# Patient Record
Sex: Male | Born: 1945
Health system: Southern US, Community
[De-identification: ages and names within clinical notes are randomized; demographics above are authoritative.]

## PROBLEM LIST (undated history)

## (undated) DIAGNOSIS — I10 Essential (primary) hypertension: Secondary | ICD-10-CM

## (undated) DIAGNOSIS — D649 Anemia, unspecified: Secondary | ICD-10-CM

## (undated) DIAGNOSIS — C3490 Malignant neoplasm of unspecified part of unspecified bronchus or lung: Secondary | ICD-10-CM

## (undated) DIAGNOSIS — E785 Hyperlipidemia, unspecified: Secondary | ICD-10-CM

## (undated) DIAGNOSIS — R0600 Dyspnea, unspecified: Secondary | ICD-10-CM

## (undated) HISTORY — DX: Malignant neoplasm of unspecified part of unspecified bronchus or lung: C34.90

## (undated) HISTORY — PX: OTHER SURGICAL HISTORY: SHX169

## (undated) HISTORY — PX: CORONARY ANGIOPLASTY: SHX604

## (undated) HISTORY — PX: FRACTURE SURGERY: SHX138

---
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
## 2000-09-06 ENCOUNTER — Ambulatory Visit (HOSPITAL_COMMUNITY): Admission: RE | Admit: 2000-09-06 | Discharge: 2000-09-06 | Payer: Self-pay | Admitting: Gastroenterology

## 2000-09-06 ENCOUNTER — Encounter: Payer: Self-pay | Admitting: Gastroenterology

## 2006-12-21 ENCOUNTER — Emergency Department: Payer: Self-pay | Admitting: Emergency Medicine

## 2007-01-26 ENCOUNTER — Ambulatory Visit (HOSPITAL_COMMUNITY): Admission: RE | Admit: 2007-01-26 | Discharge: 2007-01-26 | Payer: Self-pay | Admitting: Orthopedic Surgery

## 2007-03-05 ENCOUNTER — Ambulatory Visit: Payer: Self-pay | Admitting: Family Medicine

## 2016-04-17 DIAGNOSIS — I10 Essential (primary) hypertension: Secondary | ICD-10-CM | POA: Insufficient documentation

## 2016-05-23 DIAGNOSIS — I208 Other forms of angina pectoris: Secondary | ICD-10-CM | POA: Diagnosis present

## 2016-06-07 ENCOUNTER — Ambulatory Visit
Admission: RE | Admit: 2016-06-07 | Discharge: 2016-06-08 | Disposition: A | Discharge status: Home / Self Care | Payer: Medicare Other | Source: Ambulatory Visit | Attending: Cardiology | Admitting: Cardiology

## 2016-06-07 ENCOUNTER — Encounter: Payer: Self-pay | Admitting: *Deleted

## 2016-06-07 ENCOUNTER — Encounter
Admission: RE | Disposition: A | Discharge status: Home / Self Care | Payer: Self-pay | Source: Ambulatory Visit | Attending: Cardiology

## 2016-06-07 DIAGNOSIS — Z87891 Personal history of nicotine dependence: Secondary | ICD-10-CM | POA: Insufficient documentation

## 2016-06-07 DIAGNOSIS — I208 Other forms of angina pectoris: Secondary | ICD-10-CM | POA: Diagnosis present

## 2016-06-07 DIAGNOSIS — Z79899 Other long term (current) drug therapy: Secondary | ICD-10-CM | POA: Insufficient documentation

## 2016-06-07 DIAGNOSIS — I10 Essential (primary) hypertension: Secondary | ICD-10-CM | POA: Diagnosis not present

## 2016-06-07 DIAGNOSIS — E7800 Pure hypercholesterolemia, unspecified: Secondary | ICD-10-CM | POA: Insufficient documentation

## 2016-06-07 DIAGNOSIS — I2510 Atherosclerotic heart disease of native coronary artery without angina pectoris: Secondary | ICD-10-CM | POA: Diagnosis present

## 2016-06-07 DIAGNOSIS — I25118 Atherosclerotic heart disease of native coronary artery with other forms of angina pectoris: Secondary | ICD-10-CM | POA: Insufficient documentation

## 2016-06-07 HISTORY — DX: Essential (primary) hypertension: I10

## 2016-06-07 HISTORY — PX: CARDIAC CATHETERIZATION: SHX172

## 2016-06-07 SURGERY — LEFT HEART CATH AND CORONARY ANGIOGRAPHY
Anesthesia: Moderate Sedation

## 2016-06-07 MED ORDER — SODIUM CHLORIDE 0.9% FLUSH: 3.0000 mL | INTRAVENOUS | Status: DC | PRN

## 2016-06-07 MED ORDER — HYDRALAZINE HCL 20 MG/ML IJ SOLN: 5.0000 mg | INTRAMUSCULAR | Status: AC | PRN

## 2016-06-07 MED ORDER — CLOPIDOGREL BISULFATE 75 MG PO TABS
ORAL_TABLET | ORAL | Status: AC
  Filled 2016-06-07: qty 3

## 2016-06-07 MED ORDER — SODIUM CHLORIDE 0.9 % WEIGHT BASED INFUSION
1.0000 mL/kg/h | INTRAVENOUS | Status: AC
  Administered 2016-06-07: 1 mL/kg/h via INTRAVENOUS

## 2016-06-07 MED ORDER — CLOPIDOGREL BISULFATE 75 MG PO TABS
ORAL_TABLET | ORAL | Status: DC | PRN
Start: 2016-06-07 — End: 2016-06-07
  Administered 2016-06-07: 600 mg via ORAL

## 2016-06-07 MED ORDER — HEPARIN (PORCINE) IN NACL 2-0.9 UNIT/ML-% IJ SOLN
INTRAMUSCULAR | Status: AC
Start: 2016-06-07 — End: 2016-06-07
  Filled 2016-06-07: qty 500

## 2016-06-07 MED ORDER — SODIUM CHLORIDE 0.9% FLUSH: 3.0000 mL | Freq: Two times a day (BID) | INTRAVENOUS | Status: DC

## 2016-06-07 MED ORDER — NITROGLYCERIN 1 MG/10 ML FOR IR/CATH LAB
INTRA_ARTERIAL | Status: DC | PRN
  Administered 2016-06-07: 300 ug via INTRACORONARY
  Administered 2016-06-07: 300 mL via INTRACORONARY

## 2016-06-07 MED ORDER — SODIUM CHLORIDE 0.9 % IV SOLN: 250.0000 mL | INTRAVENOUS | Status: DC | PRN

## 2016-06-07 MED ORDER — NITROGLYCERIN 5 MG/ML IV SOLN
INTRAVENOUS | Status: AC
Start: 2016-06-07 — End: 2016-06-07
  Filled 2016-06-07: qty 10

## 2016-06-07 MED ORDER — BIVALIRUDIN BOLUS VIA INFUSION - CUPID
INTRAVENOUS | Status: DC | PRN
  Administered 2016-06-07: 66 mg via INTRAVENOUS

## 2016-06-07 MED ORDER — IOPAMIDOL (ISOVUE-300) INJECTION 61%
INTRAVENOUS | Status: DC | PRN
  Administered 2016-06-07: 125 mL via INTRAVENOUS
  Administered 2016-06-07: 130 mL via INTRAVENOUS

## 2016-06-07 MED ORDER — ASPIRIN 81 MG PO CHEW
81.0000 mg | CHEWABLE_TABLET | Freq: Every day | ORAL | Status: DC
  Administered 2016-06-08: 81 mg via ORAL
  Filled 2016-06-07: qty 1

## 2016-06-07 MED ORDER — MIDAZOLAM HCL 2 MG/2ML IJ SOLN
INTRAMUSCULAR | Status: DC | PRN
  Administered 2016-06-07: 1 mg via INTRAVENOUS

## 2016-06-07 MED ORDER — MIDAZOLAM HCL 2 MG/2ML IJ SOLN
INTRAMUSCULAR | Status: AC
  Filled 2016-06-07: qty 2

## 2016-06-07 MED ORDER — BIVALIRUDIN 250 MG IV SOLR
INTRAVENOUS | Status: DC | PRN
  Administered 2016-06-07: 1.75 mg/kg/h via INTRAVENOUS

## 2016-06-07 MED ORDER — FENTANYL CITRATE (PF) 100 MCG/2ML IJ SOLN
INTRAMUSCULAR | Status: AC
  Filled 2016-06-07: qty 2

## 2016-06-07 MED ORDER — LABETALOL HCL 5 MG/ML IV SOLN: 10.0000 mg | INTRAVENOUS | Status: AC | PRN

## 2016-06-07 MED ORDER — BIVALIRUDIN 250 MG IV SOLR
INTRAVENOUS | Status: AC
  Filled 2016-06-07: qty 250

## 2016-06-07 MED ORDER — SODIUM CHLORIDE 0.9 % WEIGHT BASED INFUSION: 3.0000 mL/kg/h | INTRAVENOUS | Status: DC

## 2016-06-07 MED ORDER — METOPROLOL TARTRATE 25 MG PO TABS
25.0000 mg | ORAL_TABLET | Freq: Two times a day (BID) | ORAL | Status: DC
  Administered 2016-06-07: 25 mg via ORAL
  Filled 2016-06-07: qty 1

## 2016-06-07 MED ORDER — ONDANSETRON HCL 4 MG/2ML IJ SOLN: 4.0000 mg | Freq: Four times a day (QID) | INTRAMUSCULAR | Status: DC | PRN

## 2016-06-07 MED ORDER — ASPIRIN 81 MG PO CHEW
CHEWABLE_TABLET | ORAL | Status: AC
  Filled 2016-06-07: qty 4

## 2016-06-07 MED ORDER — ASPIRIN 81 MG PO CHEW
CHEWABLE_TABLET | ORAL | Status: DC | PRN
  Administered 2016-06-07: 324 mg via ORAL

## 2016-06-07 MED ORDER — FENTANYL CITRATE (PF) 100 MCG/2ML IJ SOLN
INTRAMUSCULAR | Status: DC | PRN
  Administered 2016-06-07: 50 ug via INTRAVENOUS

## 2016-06-07 MED ORDER — SODIUM CHLORIDE 0.9 % WEIGHT BASED INFUSION: 1.0000 mL/kg/h | INTRAVENOUS | Status: DC

## 2016-06-07 MED ORDER — SODIUM CHLORIDE 0.9% FLUSH
3.0000 mL | Freq: Two times a day (BID) | INTRAVENOUS | Status: DC
  Administered 2016-06-07 (×2): 3 mL via INTRAVENOUS

## 2016-06-07 MED ORDER — ACETAMINOPHEN 325 MG PO TABS: 650.0000 mg | ORAL_TABLET | ORAL | Status: DC | PRN

## 2016-06-07 MED ORDER — ASPIRIN 81 MG PO CHEW: 81.0000 mg | CHEWABLE_TABLET | ORAL | Status: DC

## 2016-06-07 MED ORDER — CLOPIDOGREL BISULFATE 75 MG PO TABS
ORAL_TABLET | ORAL | Status: AC
  Filled 2016-06-07: qty 7

## 2016-06-07 MED ORDER — CLOPIDOGREL BISULFATE 75 MG PO TABS
75.0000 mg | ORAL_TABLET | Freq: Every day | ORAL | Status: DC
  Administered 2016-06-08: 75 mg via ORAL
  Filled 2016-06-07: qty 1

## 2016-06-07 SURGICAL SUPPLY — 15 items
CATH 5FR PIGTAIL DIAGNOSTIC (CATHETERS) ×1 IMPLANT
CATH INFINITI 5FR JL4 (CATHETERS) ×1 IMPLANT
CATH INFINITI JR4 5F (CATHETERS) ×1 IMPLANT
CATH VISTA GUIDE 6FR JR4 SH (CATHETERS) ×1 IMPLANT
DEVICE CLOSURE MYNXGRIP 6/7F (Vascular Products) ×1 IMPLANT
DEVICE INFLAT 30 PLUS (MISCELLANEOUS) ×1 IMPLANT
KIT MANI 3VAL PERCEP (MISCELLANEOUS) ×3 IMPLANT
NDL PERC 18GX7CM (NEEDLE) IMPLANT
NEEDLE PERC 18GX7CM (NEEDLE) ×3 IMPLANT
PACK CARDIAC CATH (CUSTOM PROCEDURE TRAY) ×3 IMPLANT
SHEATH AVANTI 6FR X 11CM (SHEATH) ×1 IMPLANT
SHEATH PINNACLE 5F 10CM (SHEATH) ×1 IMPLANT
STENT XIENCE ALPINE RX 2.25X28 (Permanent Stent) ×1 IMPLANT
WIRE ASAHI PROWATER 180CM (WIRE) ×1 IMPLANT
WIRE EMERALD 3MM-J .035X150CM (WIRE) ×1 IMPLANT

## 2016-06-07 NOTE — Progress Notes (Signed)
Patient admitted to unit. Oriented to room, call bell, and staff. Bed in lowest position. Fall safety plan reviewed. Full assessment to Epic. Skin assessment verified with Casey Burkitt. Telemetry box verification with tele clerk and Jamie NT- Box#: ---[id number]. Will continue to monitor. Cath site WDL. No complaints at this time.

## 2016-06-07 NOTE — OR Nursing (Signed)
Report called to the floor.  Pt resting in bed, no active bleeding or hematoma

## 2016-06-07 NOTE — Care Management (Signed)
Elective cardiac cath resulting in PCI. At present, patient is not on a cost prohibitive antiplatelet.

## 2016-06-08 DIAGNOSIS — I25118 Atherosclerotic heart disease of native coronary artery with other forms of angina pectoris: Secondary | ICD-10-CM | POA: Diagnosis not present

## 2016-06-08 LAB — BASIC METABOLIC PANEL
Anion gap: 4 — ABNORMAL LOW (ref 5–15)
BUN: 7 mg/dL (ref 6–20)
CO2: 25 mmol/L (ref 22–32)
Calcium: 8.3 mg/dL — ABNORMAL LOW (ref 8.9–10.3)
Chloride: 110 mmol/L (ref 101–111)
Creatinine, Ser: 0.91 mg/dL (ref 0.61–1.24)
GFR calc Af Amer: >60 mL/min (ref >60)
GFR calc non Af Amer: >60 mL/min (ref >60)
Glucose, Bld: 96 mg/dL (ref 65–99)
Potassium: 3.7 mmol/L (ref 3.5–5.1)
Sodium: 139 mmol/L (ref 135–145)

## 2016-06-08 LAB — CBC
HCT: 40.7 % (ref 40.0–52.0)
Hemoglobin: 13 g/dL (ref 13.0–18.0)
MCH: 28.6 pg (ref 26.0–34.0)
MCHC: 32.1 g/dL (ref 32.0–36.0)
MCV: 89.1 fL (ref 80.0–100.0)
Platelets: 121 10*3/uL — ABNORMAL LOW (ref 150–440)
RBC: 4.56 MIL/uL (ref 4.40–5.90)
RDW: 14.2 % (ref 11.5–14.5)
WBC: 5.9 10*3/uL (ref 3.8–10.6)

## 2016-06-08 MED ORDER — ASPIRIN 81 MG PO CHEW: 81.0000 mg | CHEWABLE_TABLET | Freq: Every day | ORAL | 4 refills | Status: DC

## 2016-06-08 MED ORDER — ATORVASTATIN CALCIUM 80 MG PO TABS: 80.0000 mg | ORAL_TABLET | Freq: Every day | ORAL | 4 refills | Status: DC

## 2016-06-08 MED ORDER — CLOPIDOGREL BISULFATE 75 MG PO TABS: 75.0000 mg | ORAL_TABLET | Freq: Every day | ORAL | 4 refills | Status: AC

## 2016-06-08 NOTE — Discharge Summary (Signed)
[facility] Cardiology Discharge Summary  Patient ID: Ryan Harvey MRN: [id number] DOB/AGE: [birth year] 70 y.o.  Admit date: 06/07/2016 Discharge date: 06/08/2016  Primary Discharge Diagnosis: Ischemic chest pain I20.9 Secondary Discharge Diagnosis high blood pressure and high cholesterol  Significant Diagnostic Studies: Cardiac cath with left ventricular angiogram and selective coronary injection as well as PCI and stent placement of right coronary artery.  Hospital Course: The patient was admitted to specials for cardiac cath with selective coronary angiogram after full consent, risk and benefits explained, and time out called with all approprate details voiced and discussed. The patient has had progressive canadian class 3 angina with high probability risk stress test consistent with ischemic chest pain and or anginal equivalent with coronary artery risk factors including high blood pressure and high cholesterol. The procedure was performed without complication and it revealed normal left ventricular function with ejection fraction of 55%.  It was found that the patient had severe 1 vessel coronary atherosclerosis with significant right coronary artery stenosis requiring further intervention. Therefore, the patient had a PCI and drug eluding stent placed without complication. The patient has been ambulating without further significant symptoms and has reached his maximal hospital benefit and will be discharged to home in good condition.  Cardiac rehabilitation has been discussed and recommended. Medication management of cardiovascular risk factors will be given post discharge and modified as an outpatient.   Discharge Exam: Blood pressure (!) 163/72, pulse (!) 59, temperature 97.8 F (36.6 C), temperature source Oral, resp. rate 18, height '5\' 9"'$  (1.753 m), weight 88.9 kg (196 lb), SpO2 95 %.  Constitutional: [place] oriented to person, place, and time. No distress.  HENT: No nasal  discharge.  Head: Normocephalic and atraumatic.  Eyes: Pupils are equal and round. No discharge.  Neck: Normal range of motion. Neck supple. No JVD present. No thyromegaly present.  Cardiovascular: Normal rate, regular rhythm, normal S1 S2, no gallop, no friction rub. No murmur Pulmonary/Chest: Effort normal, No stridor. No respiratory distress. no wheezes.  no rales.    Abdominal: Soft. Bowel sounds are normal.  no distension.  no tenderness. There is no rebound and no guarding.  Musculoskeletal: No edema, no cyanosis, normal pulses, no bleeding, Normal range of motion. no tenderness.  Neurological:  alert and oriented to person, place, and time. Coordination normal.  Skin: Skin is warm and dry. No rash noted. No erythema. No pallor.  Psychiatric:  normal mood and affect. behavior is normal.    Labs:   Lab Results  Component Value Date   WBC 5.9 06/08/2016   HGB 13.0 06/08/2016   HCT 40.7 06/08/2016   MCV 89.1 06/08/2016   PLT 121 (L) 06/08/2016    Recent Labs Lab 06/08/16 0335  NA 139  K 3.7  CL 110  CO2 25  BUN 7  CREATININE 0.91  CALCIUM 8.3*  GLUCOSE 96    EKG: NSR without evidence of new changes  FOLLOW UP IN ONE TO TWO WEEKS  Allergies as of 06/08/2016      Reactions   Shellfish Allergy       Medication List    TAKE these medications   aspirin 81 MG chewable tablet Chew 1 tablet (81 mg total) by mouth daily.   atorvastatin 80 MG tablet Commonly known as:  LIPITOR Take 1 tablet (80 mg total) by mouth daily.   clopidogrel 75 MG tablet Commonly known as:  PLAVIX Take 1 tablet (75 mg total) by mouth daily with breakfast.  metoprolol tartrate 25 MG tablet Commonly known as:  LOPRESSOR Take 25 mg by mouth 2 (two) times daily.        THE PATIENT  SHALL BRING ALL MEDICATIONS TO FOLLOW UP APPOINTMENT  Signed:  Corey Skains MD, [facility] 06/08/2016, 8:02 AM

## 2016-06-08 NOTE — Progress Notes (Signed)
Patient vitals stable, cath femoral site stable, level 0. No pain. Patient discharged to home. Instructions, prescription pickup instructions, and follow up MD appt given to patient.   IVs and tele d/c'd and removed.  Patient leaving floor via wheelchair.

## 2016-06-23 DIAGNOSIS — E782 Mixed hyperlipidemia: Secondary | ICD-10-CM | POA: Insufficient documentation

## 2018-06-27 ENCOUNTER — Ambulatory Visit: Admission: RE | Admit: 2018-06-27 | Payer: Medicare Other | Source: Home / Self Care | Admitting: Internal Medicine

## 2018-06-27 ENCOUNTER — Encounter: Admission: RE | Payer: Self-pay | Source: Home / Self Care

## 2018-06-27 SURGERY — LEFT HEART CATH AND CORONARY ANGIOGRAPHY
Anesthesia: Moderate Sedation | Laterality: Left

## 2018-07-09 ENCOUNTER — Other Ambulatory Visit: Payer: Self-pay

## 2018-07-09 ENCOUNTER — Encounter (INDEPENDENT_AMBULATORY_CARE_PROVIDER_SITE_OTHER): Payer: Self-pay

## 2018-07-09 ENCOUNTER — Inpatient Hospital Stay: Payer: Medicare Other

## 2018-07-09 ENCOUNTER — Inpatient Hospital Stay: Payer: Medicare Other | Attending: Oncology | Admitting: Oncology

## 2018-07-09 ENCOUNTER — Encounter: Payer: Self-pay | Admitting: Oncology

## 2018-07-09 VITALS — BP 140/63 | HR 69 | Resp 18

## 2018-07-09 VITALS — BP 139/57 | HR 64 | Temp 97.1°F | Resp 16 | Ht 69.0 in | Wt 185.5 lb

## 2018-07-09 DIAGNOSIS — D500 Iron deficiency anemia secondary to blood loss (chronic): Secondary | ICD-10-CM

## 2018-07-09 DIAGNOSIS — D509 Iron deficiency anemia, unspecified: Secondary | ICD-10-CM | POA: Diagnosis not present

## 2018-07-09 LAB — CBC WITH DIFFERENTIAL/PLATELET
Abs Immature Granulocytes: 0.01 10*3/uL (ref 0.00–0.07)
Basophils Absolute: 0 10*3/uL (ref 0.0–0.1)
Basophils Relative: 1 %
Eosinophils Absolute: 0.1 10*3/uL (ref 0.0–0.5)
Eosinophils Relative: 2 %
HCT: 31.5 % — ABNORMAL LOW (ref 39.0–52.0)
Hemoglobin: 8.1 g/dL — ABNORMAL LOW (ref 13.0–17.0)
Immature Granulocytes: 0 %
Lymphocytes Relative: 26 %
Lymphs Abs: 1.5 10*3/uL (ref 0.7–4.0)
MCH: 19.8 pg — ABNORMAL LOW (ref 26.0–34.0)
MCHC: 25.7 g/dL — ABNORMAL LOW (ref 30.0–36.0)
MCV: 76.8 fL — ABNORMAL LOW (ref 80.0–100.0)
Monocytes Absolute: 0.4 10*3/uL (ref 0.1–1.0)
Monocytes Relative: 6 %
Neutro Abs: 3.8 10*3/uL (ref 1.7–7.7)
Neutrophils Relative %: 65 %
Platelets: 226 10*3/uL (ref 150–400)
RBC: 4.1 MIL/uL — ABNORMAL LOW (ref 4.22–5.81)
RDW: 16.3 % — ABNORMAL HIGH (ref 11.5–15.5)
WBC: 5.9 10*3/uL (ref 4.0–10.5)
nRBC: 0 % (ref 0.0–0.2)

## 2018-07-09 LAB — FOLATE: Folate: 15.4 ng/mL (ref >5.9)

## 2018-07-09 LAB — RETICULOCYTES
Immature Retic Fract: 17.3 % — ABNORMAL HIGH (ref 2.3–15.9)
RBC.: 4.1 MIL/uL — ABNORMAL LOW (ref 4.22–5.81)
Retic Count, Absolute: 47.6 10*3/uL (ref 19.0–186.0)
Retic Ct Pct: 1.2 % (ref 0.4–3.1)

## 2018-07-09 LAB — TSH: TSH: 3.821 u[IU]/mL (ref 0.350–4.500)

## 2018-07-09 LAB — URINALYSIS, COMPLETE (UACMP) WITH MICROSCOPIC
Bacteria, UA: NONE SEEN
Bilirubin Urine: NEGATIVE
Glucose, UA: NEGATIVE mg/dL
Hgb urine dipstick: NEGATIVE
Ketones, ur: NEGATIVE mg/dL
Leukocytes, UA: NEGATIVE
Nitrite: NEGATIVE
Protein, ur: NEGATIVE mg/dL
Specific Gravity, Urine: 1.018 (ref 1.005–1.030)
pH: 5 (ref 5.0–8.0)

## 2018-07-09 LAB — SAMPLE TO BLOOD BANK

## 2018-07-09 LAB — VITAMIN B12: Vitamin B-12: 456 pg/mL (ref 180–914)

## 2018-07-09 MED ORDER — SODIUM CHLORIDE 0.9 % IV SOLN
510.0000 mg | Freq: Once | INTRAVENOUS | Status: AC
  Administered 2018-07-09: 510 mg via INTRAVENOUS
  Filled 2018-07-09: qty 17

## 2018-07-09 MED ORDER — SODIUM CHLORIDE 0.9 % IV SOLN
Freq: Once | INTRAVENOUS | Status: AC
  Administered 2018-07-09: 12:00:00 via INTRAVENOUS
  Filled 2018-07-09: qty 250

## 2018-07-09 NOTE — Progress Notes (Signed)
Hematology/Oncology Consult note [facility] [phone] [phone]  Patient Care Team: Patient, No Pcp Per as PCP - General (General Practice)   Name of the patient: Ryan Harvey  [id number]  11-22-1945    Reason for referral-iron deficiency anemia   Referring [place]  Date of visit: 07/09/18   History of presenting illness-patient is a 73 year old male who is been referred to [place] for iron deficiency anemia.  His recent CBC from 07/04/2018 showed white count of 5.8, H&H of 7.9/29.5 with an MCV of 77.2 and a platelet count of 199.  Prior to that on 06/21/2018 his H&H was 8.1/29.8.  His hemoglobin at baseline was normal at 15 back in 2017.  Iron studies reveal evidence of iron deficiency with a ferritin of 4, low iron saturation of 15 and a TIBC that was elevated at 576.  Folic acid levels were normal.  Stool occult cards were negative but patient was seen by GI and will be undergoing EGD and colonoscopy tomorrow.  He took iron tablets for 2 days but now it is currently on hold in view of anticipated endoscopy.  He reports no consistent use of NSAIDs.  Denies any blood in his stool or urine.  Denies any dark melanotic stools.  Patient also has ongoing cardiac evaluation given that he did not clear his stress test and may potentially need cardiac stenting in the future.  His anemia was incidentally diagnosed during his cardiac evaluation   ECOG PS- 1  Pain scale- 0   Review of systems- Review of Systems  Constitutional: Positive for malaise/fatigue. Negative for chills, fever and weight loss.  HENT: Negative for congestion, ear discharge and nosebleeds.   Eyes: Negative for blurred vision.  Respiratory: Negative for cough, hemoptysis, sputum production, shortness of breath and wheezing.   Cardiovascular: Negative for chest pain, palpitations, orthopnea and claudication.  Gastrointestinal: Negative for abdominal pain, blood in stool,  constipation, diarrhea, heartburn, melena, nausea and vomiting.  Genitourinary: Negative for dysuria, flank pain, frequency, hematuria and urgency.  Musculoskeletal: Negative for back pain, joint pain and myalgias.  Skin: Negative for rash.  Neurological: Negative for dizziness, tingling, focal weakness, seizures, weakness and headaches.  Endo/Heme/Allergies: Does not bruise/bleed easily.  Psychiatric/Behavioral: Negative for depression and suicidal ideas. The patient does not have insomnia.     Allergies  Allergen Reactions  . Shellfish Allergy Rash and Swelling    Patient Active Problem List   Diagnosis Date Noted  . Iron deficiency anemia due to chronic blood loss 07/09/2018  . CAD (coronary artery disease) 06/07/2016  . Stable angina ([place]) 05/23/2016     Past Medical History:  Diagnosis Date  . Hypertension      Past Surgical History:  Procedure Laterality Date  . CARDIAC CATHETERIZATION Left 06/07/2016   Procedure: Left Heart Cath and Coronary Angiography;  Surgeon: Corey Skains, MD;  Location: [place] CV LAB;  Service: Cardiovascular;  Laterality: Left;  . CARDIAC CATHETERIZATION N/A 06/07/2016   Procedure: Coronary Stent Intervention;  Surgeon: Isaias Cowman, MD;  Location: [place] CV LAB;  Service: Cardiovascular;  Laterality: N/A;    Social History   Socioeconomic History  . Marital status: Married    Spouse name: Not on file  . Number of children: Not on file  . Years of education: Not on file  . Highest education level: Not on file  Occupational History  . Not on file  Social Needs  . Financial resource strain: Not on file  .  Food insecurity:    Worry: Not on file    Inability: Not on file  . Transportation needs:    Medical: Not on file    Non-medical: Not on file  Tobacco Use  . Smoking status: Former Research scientist (life sciences)  . Smokeless tobacco: Never Used  Substance and Sexual Activity  . Alcohol use: Not Currently  . Drug use: No  .  Sexual activity: Not on file  Lifestyle  . Physical activity:    Days per week: Not on file    Minutes per session: Not on file  . Stress: Not on file  Relationships  . Social connections:    Talks on phone: Not on file    Gets together: Not on file    Attends religious service: Not on file    Active member of club or organization: Not on file    Attends meetings of clubs or organizations: Not on file    Relationship status: Not on file  . Intimate partner violence:    Fear of current or ex partner: Not on file    Emotionally abused: Not on file    Physically abused: Not on file    Forced sexual activity: Not on file  Other Topics Concern  . Not on file  Social History Narrative  . Not on file     Family History  Problem Relation Age of Onset  . Hypertension Mother   . Stroke Father   . Heart disease Brother   . Cancer Brother   . Peripheral Artery Disease Brother      Current Outpatient Medications:  .  amLODipine (NORVASC) 5 MG tablet, Take 5 mg by mouth every evening., Disp: , Rfl:  .  atorvastatin (LIPITOR) 80 MG tablet, Take 1 tablet (80 mg total) by mouth daily. (Patient taking differently: Take 80 mg by mouth every evening. ), Disp: 90 tablet, Rfl: 4 .  clopidogrel (PLAVIX) 75 MG tablet, Take 1 tablet (75 mg total) by mouth daily with breakfast. (Patient taking differently: Take 75 mg by mouth every evening. ), Disp: 90 tablet, Rfl: 4 .  cyanocobalamin (,VITAMIN B-12,) 1000 MCG/ML injection, Inject 1 mL into the muscle every 21 ( twenty-one) days. For 1 month, Disp: , Rfl:  .  Iron-Vitamin C (VITRON-C) 65-125 MG TABS, Take 1 tablet by mouth daily., Disp: , Rfl:  .  Misc Natural Products (NF FORMULAS TESTOSTERONE) CAPS, Take 1 capsule by mouth daily as needed (energy)., Disp: , Rfl:  .  Multiple Vitamin (MULTIVITAMIN WITH MINERALS) TABS tablet, Take 1 tablet by mouth daily as needed (energy)., Disp: , Rfl:  .  Vitamin D, Ergocalciferol, (DRISDOL) 1.25 MG (50000 UT)  CAPS capsule, Take 1 capsule by mouth once a week. For 8 weeks, Disp: , Rfl:    Physical exam:  Vitals:   07/09/18 0951 07/09/18 1006  BP:  (!) 139/57  Pulse:  64  Resp: 16   Temp:  (!) 97.1 F (36.2 C)  TempSrc:  Tympanic  SpO2:  98%  Weight: 185 lb 8 oz (84.1 kg)   Height: 5\' 9"  (1.753 m)    Physical Exam Constitutional:      General: He is not in acute distress. HENT:     Head: Normocephalic and atraumatic.  Eyes:     Pupils: Pupils are equal, round, and reactive to light.  Neck:     Musculoskeletal: Normal range of motion.  Cardiovascular:     Rate and Rhythm: Normal rate and regular rhythm.  Heart sounds: Normal heart sounds.  Pulmonary:     Effort: Pulmonary effort is normal.     Breath sounds: Normal breath sounds.  Abdominal:     General: Bowel sounds are normal.     Palpations: Abdomen is soft.  Skin:    General: Skin is warm and dry.  Neurological:     Mental Status: He is alert and oriented to person, place, and time.        CMP Latest Ref Rng & Units 06/08/2016  Glucose 65 - 99 mg/dL 96  BUN 6 - 20 mg/dL 7  Creatinine 0.61 - 1.24 mg/dL 0.91  Sodium 135 - 145 mmol/L 139  Potassium 3.5 - 5.1 mmol/L 3.7  Chloride 101 - 111 mmol/L 110  CO2 22 - 32 mmol/L 25  Calcium 8.9 - 10.3 mg/dL 8.3(L)   CBC Latest Ref Rng & Units 07/09/2018  WBC 4.0 - 10.5 K/uL 5.9  Hemoglobin 13.0 - 17.0 g/dL 8.1(L)  Hematocrit 39.0 - 52.0 % 31.5(L)  Platelets 150 - 400 K/uL 226     Assessment and plan- Patient is a 73 y.o. male referred for iron deficiency anemia possibly secondary to GI bleeding  His recent labs reveal evidence of significant iron deficiency anemia.  He is scheduled for EGD and colonoscopy today.  At this time I would recommend IV iron Feraheme 510 mg x 2 doses.  Discussed risks and benefits of Feraheme including all but not limited to headache, leg swelling and possible risk of infusion reaction.  Patient understands and agrees to proceed as planned.   We will also check his other anemia work-up including B12, folate, reticulocyte count, TSH and urinalysis.  I will see him back in 2 months time with repeat CBC ferritin and iron studies.  He will likely undergo capsule endoscopy if EGD and colonoscopy are negative   Thank you for this kind referral and the opportunity to participate in the care of this patient   Visit Diagnosis 1. Iron deficiency anemia due to chronic blood loss   2. Microcytic anemia     Dr. Randa Evens, MD, MPH [facility] [phone] 07/09/2018 1:18 PM

## 2018-07-09 NOTE — Progress Notes (Signed)
Patient here for initial visit, he reports dyspnea which has gotten worse in the last month. He also reports occasional numb feeling in his entire body. Appetite fair. No problems with sleep and rest.

## 2018-07-10 ENCOUNTER — Encounter
Admission: RE | Disposition: A | Discharge status: Home / Self Care | Payer: Self-pay | Source: Home / Self Care | Attending: Internal Medicine

## 2018-07-10 ENCOUNTER — Ambulatory Visit: Payer: Medicare Other | Admitting: Anesthesiology

## 2018-07-10 ENCOUNTER — Ambulatory Visit
Admission: RE | Admit: 2018-07-10 | Discharge: 2018-07-10 | Disposition: A | Discharge status: Home / Self Care | Payer: Medicare Other | Attending: Internal Medicine | Admitting: Internal Medicine

## 2018-07-10 ENCOUNTER — Encounter: Payer: Self-pay | Admitting: Anesthesiology

## 2018-07-10 DIAGNOSIS — Z832 Family history of diseases of the blood and blood-forming organs and certain disorders involving the immune mechanism: Secondary | ICD-10-CM | POA: Diagnosis not present

## 2018-07-10 DIAGNOSIS — K222 Esophageal obstruction: Secondary | ICD-10-CM | POA: Insufficient documentation

## 2018-07-10 DIAGNOSIS — K640 First degree hemorrhoids: Secondary | ICD-10-CM | POA: Diagnosis not present

## 2018-07-10 DIAGNOSIS — Z823 Family history of stroke: Secondary | ICD-10-CM | POA: Diagnosis not present

## 2018-07-10 DIAGNOSIS — Z87891 Personal history of nicotine dependence: Secondary | ICD-10-CM | POA: Insufficient documentation

## 2018-07-10 DIAGNOSIS — K621 Rectal polyp: Secondary | ICD-10-CM | POA: Diagnosis not present

## 2018-07-10 DIAGNOSIS — K31811 Angiodysplasia of stomach and duodenum with bleeding: Secondary | ICD-10-CM | POA: Insufficient documentation

## 2018-07-10 DIAGNOSIS — D509 Iron deficiency anemia, unspecified: Secondary | ICD-10-CM | POA: Diagnosis not present

## 2018-07-10 DIAGNOSIS — Z7902 Long term (current) use of antithrombotics/antiplatelets: Secondary | ICD-10-CM | POA: Insufficient documentation

## 2018-07-10 DIAGNOSIS — Z91013 Allergy to seafood: Secondary | ICD-10-CM | POA: Diagnosis not present

## 2018-07-10 DIAGNOSIS — K2210 Ulcer of esophagus without bleeding: Secondary | ICD-10-CM | POA: Diagnosis not present

## 2018-07-10 DIAGNOSIS — Z8249 Family history of ischemic heart disease and other diseases of the circulatory system: Secondary | ICD-10-CM | POA: Insufficient documentation

## 2018-07-10 DIAGNOSIS — K210 Gastro-esophageal reflux disease with esophagitis: Secondary | ICD-10-CM | POA: Diagnosis not present

## 2018-07-10 DIAGNOSIS — Z808 Family history of malignant neoplasm of other organs or systems: Secondary | ICD-10-CM | POA: Diagnosis not present

## 2018-07-10 DIAGNOSIS — I25119 Atherosclerotic heart disease of native coronary artery with unspecified angina pectoris: Secondary | ICD-10-CM | POA: Diagnosis not present

## 2018-07-10 DIAGNOSIS — I10 Essential (primary) hypertension: Secondary | ICD-10-CM | POA: Insufficient documentation

## 2018-07-10 DIAGNOSIS — K449 Diaphragmatic hernia without obstruction or gangrene: Secondary | ICD-10-CM | POA: Insufficient documentation

## 2018-07-10 DIAGNOSIS — Z833 Family history of diabetes mellitus: Secondary | ICD-10-CM | POA: Insufficient documentation

## 2018-07-10 DIAGNOSIS — Z79899 Other long term (current) drug therapy: Secondary | ICD-10-CM | POA: Insufficient documentation

## 2018-07-10 HISTORY — PX: COLONOSCOPY WITH PROPOFOL: SHX5780

## 2018-07-10 HISTORY — PX: ESOPHAGOGASTRODUODENOSCOPY (EGD) WITH PROPOFOL: SHX5813

## 2018-07-10 SURGERY — COLONOSCOPY WITH PROPOFOL
Anesthesia: General

## 2018-07-10 MED ORDER — PROPOFOL 10 MG/ML IV BOLUS
INTRAVENOUS | Status: DC | PRN
  Administered 2018-07-10: 30 mg via INTRAVENOUS
  Administered 2018-07-10 (×2): 50 mg via INTRAVENOUS

## 2018-07-10 MED ORDER — PROPOFOL 500 MG/50ML IV EMUL
INTRAVENOUS | Status: DC | PRN
  Administered 2018-07-10: 80 ug/kg/min via INTRAVENOUS

## 2018-07-10 MED ORDER — PROPOFOL 500 MG/50ML IV EMUL
INTRAVENOUS | Status: AC
  Filled 2018-07-10: qty 50

## 2018-07-10 MED ORDER — SODIUM CHLORIDE 0.9 % IV SOLN
INTRAVENOUS | Status: DC
  Administered 2018-07-10: 12:00:00 via INTRAVENOUS
  Administered 2018-07-10: 1000 mL via INTRAVENOUS

## 2018-07-10 NOTE — H&P (Signed)
  Outpatient short stay form Pre-procedure 07/10/2018 11:19 AM Avaleen Brownley K. Alice Reichert, M.D.  Primary Physician: Grayland Ormond, PA-C  Reason for visit: Symptomatic iron deficiency anemia secondary to chronic blood loss.  History of present illness: 73 year old male presents for market decrease in hemoglobin to 5.8 from a baseline of 15.1.  Patient complains of dyspnea on exertion as well as chronic fatigue.  No gross melena, hematemesis or hematochezia.  Patient received IV iron at Dr. Elroy Channel office yesterday and hematology.  Patient is colonoscopy nave.  Denies abdominal pain, nausea or vomiting.  Denies any significant constipation symptoms.  Patient takes Plavix for personal history of coronary artery disease.   No current facility-administered medications for this encounter.   Current Outpatient Medications:  .  tadalafil (CIALIS) 10 MG tablet, Take 10 mg by mouth daily as needed for erectile dysfunction., Disp: , Rfl:  .  amLODipine (NORVASC) 5 MG tablet, Take 5 mg by mouth every evening., Disp: , Rfl:  .  atorvastatin (LIPITOR) 80 MG tablet, Take 1 tablet (80 mg total) by mouth daily. (Patient taking differently: Take 80 mg by mouth every evening. ), Disp: 90 tablet, Rfl: 4 .  clopidogrel (PLAVIX) 75 MG tablet, Take 1 tablet (75 mg total) by mouth daily with breakfast. (Patient taking differently: Take 75 mg by mouth every evening. ), Disp: 90 tablet, Rfl: 4 .  cyanocobalamin (,VITAMIN B-12,) 1000 MCG/ML injection, Inject 1 mL into the muscle every 21 ( twenty-one) days. For 1 month, Disp: , Rfl:  .  Iron-Vitamin C (VITRON-C) 65-125 MG TABS, Take 1 tablet by mouth daily., Disp: , Rfl:  .  Misc Natural Products (NF FORMULAS TESTOSTERONE) CAPS, Take 1 capsule by mouth daily as needed (energy)., Disp: , Rfl:  .  Multiple Vitamin (MULTIVITAMIN WITH MINERALS) TABS tablet, Take 1 tablet by mouth daily as needed (energy)., Disp: , Rfl:  .  Vitamin D, Ergocalciferol, (DRISDOL) 1.25 MG (50000 UT) CAPS  capsule, Take 1 capsule by mouth once a week. For 8 weeks, Disp: , Rfl:   No medications prior to admission.     Allergies  Allergen Reactions  . Shellfish Allergy Rash and Swelling     Past Medical History:  Diagnosis Date  . Hypertension     Review of systems:  Otherwise negative.    Physical Exam  Gen: Alert, oriented. Appears stated age.  HEENT: [place]/AT. PERRLA. Lungs: CTA, no wheezes. CV: RR nl S1, S2. Abd: soft, benign, no masses. BS+ Ext: No edema. Pulses 2+    Planned procedures: Proceed with EGD and colonoscopy. The patient understands the nature of the planned procedure, indications, risks, alternatives and potential complications including but not limited to bleeding, infection, perforation, damage to internal organs and possible oversedation/side effects from anesthesia. The patient agrees and gives consent to proceed.  Please refer to procedure notes for findings, recommendations and patient disposition/instructions.     Bradly Sangiovanni K. Alice Reichert, M.D. Gastroenterology 07/10/2018  11:19 AM

## 2018-07-10 NOTE — Anesthesia Post-op Follow-up Note (Signed)
Anesthesia QCDR form completed.        

## 2018-07-10 NOTE — Interval H&P Note (Signed)
History and Physical Interval Note:  07/10/2018 12:46 PM  Ryan Harvey  has presented today for surgery, with the diagnosis of IDA  The various methods of treatment have been discussed with the patient and family. After consideration of risks, benefits and other options for treatment, the patient has consented to  Procedure(s): COLONOSCOPY WITH PROPOFOL (N/A) ESOPHAGOGASTRODUODENOSCOPY (EGD) WITH PROPOFOL (N/A) as a surgical intervention .  The patient's history has been reviewed, patient examined, no change in status, stable for surgery.  I have reviewed the patient's chart and labs.  Questions were answered to the patient's satisfaction.     [place], [place]

## 2018-07-10 NOTE — Op Note (Signed)
[facility] Gastroenterology Patient Name: Ryan Harvey Procedure Date: 07/10/2018 12:42 PM MRN: [id number] Account #: [id number] Date of Birth: 1946/03/07 Admit Type: Outpatient Age: 73 Room: [facility] ENDO ROOM 3 Gender: Male Note Status: Finalized Procedure:            Colonoscopy Indications:          Unexplained iron deficiency anemia Providers:            Benay Pike. Alice Reichert MD, MD Referring MD:         No Local Md, MD (Referring MD) Medicines:            Propofol per Anesthesia Complications:        No immediate complications. Procedure:            Pre-Anesthesia Assessment:                       - The risks and benefits of the procedure and the                        sedation options and risks were discussed with the                        patient. All questions were answered and informed                        consent was obtained.                       - Patient identification and proposed procedure were                        verified prior to the procedure by the nurse. The                        procedure was verified in the procedure room.                       - ASA Grade Assessment: III - A patient with severe                        systemic disease.                       - After reviewing the risks and benefits, the patient                        was deemed in satisfactory condition to undergo the                        procedure.                       After obtaining informed consent, the colonoscope was                        passed under direct vision. Throughout the procedure,                        the patient's blood pressure, pulse, and oxygen  saturations were monitored continuously. The                        Colonoscope was introduced through the anus and                        advanced to the the cecum, identified by appendiceal                        orifice and ileocecal valve. The colonoscopy was    performed without difficulty. The patient tolerated the                        procedure well. The quality of the bowel preparation                        was good. The ileocecal valve, appendiceal orifice, and                        rectum were photographed. Findings:      The perianal and digital rectal examinations were normal. Pertinent       negatives include normal sphincter tone and no palpable rectal lesions.      A 4 mm polyp was found in the rectum. The polyp was sessile. The polyp       was removed with a jumbo cold forceps. Resection and retrieval were       complete.      A 8 mm polyp was found in the rectum. The polyp was semi-pedunculated.       The polyp was removed with a hot snare. Resection and retrieval were       complete.      Non-bleeding internal hemorrhoids were found during retroflexion. The       hemorrhoids were Grade I (internal hemorrhoids that do not prolapse).      The exam was otherwise without abnormality. Impression:           - One 4 mm polyp in the rectum, removed with a jumbo                        cold forceps. Resected and retrieved.                       - One 8 mm polyp in the rectum, removed with a hot                        snare. Resected and retrieved.                       - Non-bleeding internal hemorrhoids.                       - The examination was otherwise normal. Recommendation:       - Patient has a contact number available for                        emergencies. The signs and symptoms of potential                        delayed complications were discussed with the patient.  Return to normal activities tomorrow. Written discharge                        instructions were provided to the patient.                       - Await pathology results from EGD, also performed                        today.                       - Resume previous diet.                       - Continue present medications.                        - No aspirin, ibuprofen, naproxen, or other                        non-steroidal anti-inflammatory drugs.                       - To visualize the small bowel, perform video capsule                        endoscopy in 3 weeks.                       - Return to physician assistant in 6 months.                       - The findings and recommendations were discussed with                        the patient and their family. Procedure Code(s):    --- Professional ---                       [phone], Colonoscopy, flexible; with removal of tumor(s),                        polyp(s), or other lesion(s) by snare technique                       45380, 5, Colonoscopy, flexible; with biopsy, single                        or multiple Diagnosis Code(s):    --- Professional ---                       D50.9, Iron deficiency anemia, unspecified                       K64.0, First degree hemorrhoids                       K62.1, Rectal polyp CPT copyright 2018 American Medical Association. All rights reserved. The codes documented in this report are preliminary and upon coder review may  be revised to meet current compliance requirements. Efrain Sella MD, MD 07/10/2018 1:28:56 PM This report has been signed electronically. Number of Addenda: 0 Note  Initiated On: 07/10/2018 12:42 PM Scope Withdrawal Time: 0 hours 11 minutes 43 seconds  Total Procedure Duration: 0 hours 16 minutes 1 second       [facility]

## 2018-07-10 NOTE — Op Note (Signed)
[facility] Gastroenterology Patient Name: Ryan Harvey Procedure Date: 07/10/2018 12:42 PM MRN: [id number] Account #: [id number] Date of Birth: 1946/06/15 Admit Type: Outpatient Age: 73 Room: [facility] ENDO ROOM 3 Gender: Male Note Status: Finalized Procedure:            Upper GI endoscopy Indications:          Suspected upper gastrointestinal bleeding in patient                        with unexplained iron deficiency anemia Providers:            Benay Pike. Alice Reichert MD, MD Referring MD:         No Local Md, MD (Referring MD) Medicines:            Propofol per Anesthesia Complications:        No immediate complications. Procedure:            Pre-Anesthesia Assessment:                       - The risks and benefits of the procedure and the                        sedation options and risks were discussed with the                        patient. All questions were answered and informed                        consent was obtained.                       - Patient identification and proposed procedure were                        verified prior to the procedure by the nurse. The                        procedure was verified in the procedure room.                       - After reviewing the risks and benefits, the patient                        was deemed in satisfactory condition to undergo the                        procedure.                       - ASA Grade Assessment: III - A patient with severe                        systemic disease.                       After obtaining informed consent, the endoscope was                        passed under direct vision. Throughout the procedure,  the patient's blood pressure, pulse, and oxygen                        saturations were monitored continuously. The Endoscope                        was introduced through the mouth, and advanced to the                        fourth part of duodenum. The upper GI  endoscopy was                        accomplished without difficulty. The patient tolerated                        the procedure well. Findings:      LA Grade C (one or more mucosal breaks continuous between tops of 2 or       more mucosal folds, less than 75% circumference) esophagitis with no       bleeding was found in the distal esophagus.      One benign-appearing, intrinsic moderate stenosis was found in the       distal esophagus. This stenosis measured 1.2 cm (inner diameter) x less       than one cm (in length). The stenosis was traversed. Biopsies were taken       with a cold forceps for histology.      A 2 cm hiatal hernia was present.      Four small stigmata of recent bleeding angioectasias were found on the       lesser curvature of the stomach. Coagulation for hemostasis using argon       plasma at 0.8 liters/minute and 30 watts was successful.      A single 4 mm angioectasia without bleeding was found in the fourth       portion of the duodenum. Coagulation for bleeding prevention using argon       plasma at 0.5 liters/minute and 20 watts was successful.      The exam was otherwise without abnormality. Impression:           - LA Grade C reflux esophagitis.                       - Benign-appearing esophageal stenosis. Biopsied.                       - 2 cm hiatal hernia.                       - Four recently bleeding angioectasias in the stomach.                        Treated with argon plasma coagulation (APC).                       - A single non-bleeding angioectasia in the duodenum.                        Treated with argon plasma coagulation (APC).                       - The examination was otherwise  normal. Recommendation:       - Await pathology results.                       - Proceed with colonoscopy Procedure Code(s):    --- Professional ---                       [phone], 76, Esophagogastroduodenoscopy, flexible,                        transoral; with control  of bleeding, any method                       43239, Esophagogastroduodenoscopy, flexible, transoral;                        with biopsy, single or multiple Diagnosis Code(s):    --- Professional ---                       D50.9, Iron deficiency anemia, unspecified                       K31.811, Angiodysplasia of stomach and duodenum with                        bleeding                       K44.9, Diaphragmatic hernia without obstruction or                        gangrene                       K22.2, Esophageal obstruction                       K21.0, Gastro-esophageal reflux disease with esophagitis CPT copyright 2018 American Medical Association. All rights reserved. The codes documented in this report are preliminary and upon coder review may  be revised to meet current compliance requirements. Efrain Sella MD, MD 07/10/2018 1:09:13 PM This report has been signed electronically. Number of Addenda: 0 Note Initiated On: 07/10/2018 12:42 PM      [facility]

## 2018-07-10 NOTE — Transfer of Care (Signed)
Immediate Anesthesia Transfer of Care Note  Patient: Ryan Harvey  Procedure(s) Performed: COLONOSCOPY WITH PROPOFOL (N/A ) ESOPHAGOGASTRODUODENOSCOPY (EGD) WITH PROPOFOL (N/A )  Patient Location: PACU  Anesthesia Type:General  Level of Consciousness: awake  Airway & Oxygen Therapy: Patient Spontanous Breathing  Post-op Assessment: Report given to RN  Post vital signs: stable  Last Vitals:  Vitals Value Taken Time  BP 128/64 07/10/2018  1:29 PM  Temp 36 C 07/10/2018  1:29 PM  Pulse 65 07/10/2018  1:30 PM  Resp 19 07/10/2018  1:30 PM  SpO2 99 % 07/10/2018  1:30 PM  Vitals shown include unvalidated device data.  Last Pain:  Vitals:   07/10/18 1329  TempSrc: Tympanic  PainSc:       Patients Stated Pain Goal: 0 (62/56/38 [id number])  Complications: No apparent anesthesia complications

## 2018-07-11 ENCOUNTER — Encounter: Payer: Self-pay | Admitting: Internal Medicine

## 2018-07-11 NOTE — Anesthesia Preprocedure Evaluation (Addendum)
Anesthesia Evaluation  Patient identified by MRN, date of birth, ID band Patient awake    Reviewed: Allergy & Precautions, NPO status , Patient's Chart, lab work & pertinent test results, reviewed documented beta blocker date and time   Airway Mallampati: II  TM Distance: >3 FB     Dental  (+) Chipped   Pulmonary former smoker,           Cardiovascular hypertension, Pt. on medications + angina + CAD       Neuro/Psych    GI/Hepatic   Endo/Other    Renal/GU      Musculoskeletal   Abdominal   Peds  Hematology  (+) anemia ,   Anesthesia Other Findings   Reproductive/Obstetrics                             Anesthesia Physical Anesthesia Plan  ASA: III  Anesthesia Plan: General   Post-op Pain Management:    Induction: Intravenous  PONV Risk Score and Plan:   Airway Management Planned:   Additional Equipment:   Intra-op Plan:   Post-operative Plan:   Informed Consent: I have reviewed the patients History and Physical, chart, labs and discussed the procedure including the risks, benefits and alternatives for the proposed anesthesia with the patient or authorized representative who has indicated his/her understanding and acceptance.       Plan Discussed with: CRNA  Anesthesia Plan Comments:         Anesthesia Quick Evaluation

## 2018-07-12 LAB — SURGICAL PATHOLOGY

## 2018-07-12 NOTE — Anesthesia Postprocedure Evaluation (Signed)
Anesthesia Post Note  Patient: Ryan Harvey  Procedure(s) Performed: COLONOSCOPY WITH PROPOFOL (N/A ) ESOPHAGOGASTRODUODENOSCOPY (EGD) WITH PROPOFOL (N/A )  Patient location during evaluation: Endoscopy Anesthesia Type: General Level of consciousness: awake and alert Pain management: pain level controlled Vital Signs Assessment: post-procedure vital signs reviewed and stable Respiratory status: spontaneous breathing, nonlabored ventilation and respiratory function stable Cardiovascular status: blood pressure returned to baseline and stable Postop Assessment: no apparent nausea or vomiting Anesthetic complications: no     Last Vitals:  Vitals:   07/10/18 1359 07/10/18 1409  BP: 128/76   Pulse: 68 (!) 59  Resp: 13 17  Temp:    SpO2: 99% 100%    Last Pain:  Vitals:   07/11/18 0730  TempSrc:   PainSc: 0-No pain                 Alphonsus Sias

## 2018-07-16 ENCOUNTER — Inpatient Hospital Stay: Payer: Medicare Other

## 2018-07-16 VITALS — BP 128/78 | HR 72 | Temp 97.8°F | Resp 18

## 2018-07-16 DIAGNOSIS — D509 Iron deficiency anemia, unspecified: Secondary | ICD-10-CM | POA: Diagnosis not present

## 2018-07-16 DIAGNOSIS — D500 Iron deficiency anemia secondary to blood loss (chronic): Secondary | ICD-10-CM

## 2018-07-16 MED ORDER — SODIUM CHLORIDE 0.9 % IV SOLN
Freq: Once | INTRAVENOUS | Status: AC
  Administered 2018-07-16: 14:00:00 via INTRAVENOUS
  Filled 2018-07-16: qty 250

## 2018-07-16 MED ORDER — SODIUM CHLORIDE 0.9 % IV SOLN
510.0000 mg | Freq: Once | INTRAVENOUS | Status: AC
  Administered 2018-07-16: 510 mg via INTRAVENOUS
  Filled 2018-07-16: qty 17

## 2018-07-23 ENCOUNTER — Other Ambulatory Visit: Payer: Self-pay | Admitting: Gastroenterology

## 2018-07-23 DIAGNOSIS — R1314 Dysphagia, pharyngoesophageal phase: Secondary | ICD-10-CM

## 2018-07-26 ENCOUNTER — Ambulatory Visit
Admission: RE | Admit: 2018-07-26 | Discharge: 2018-07-26 | Disposition: A | Discharge status: Home / Self Care | Payer: Medicare Other | Source: Ambulatory Visit | Attending: Gastroenterology | Admitting: Gastroenterology

## 2018-07-26 DIAGNOSIS — R1314 Dysphagia, pharyngoesophageal phase: Secondary | ICD-10-CM | POA: Diagnosis not present

## 2018-07-30 DIAGNOSIS — E559 Vitamin D deficiency, unspecified: Secondary | ICD-10-CM | POA: Insufficient documentation

## 2018-07-30 DIAGNOSIS — E538 Deficiency of other specified B group vitamins: Secondary | ICD-10-CM | POA: Insufficient documentation

## 2018-08-15 DIAGNOSIS — Z7901 Long term (current) use of anticoagulants: Secondary | ICD-10-CM | POA: Insufficient documentation

## 2018-08-15 DIAGNOSIS — S4992XA Unspecified injury of left shoulder and upper arm, initial encounter: Secondary | ICD-10-CM | POA: Insufficient documentation

## 2018-08-19 ENCOUNTER — Other Ambulatory Visit: Payer: Self-pay | Admitting: Orthopedic Surgery

## 2018-08-19 ENCOUNTER — Other Ambulatory Visit (HOSPITAL_COMMUNITY): Payer: Self-pay | Admitting: Orthopedic Surgery

## 2018-08-19 DIAGNOSIS — W19XXXA Unspecified fall, initial encounter: Secondary | ICD-10-CM

## 2018-08-19 DIAGNOSIS — R29898 Other symptoms and signs involving the musculoskeletal system: Secondary | ICD-10-CM

## 2018-08-20 ENCOUNTER — Encounter: Payer: Self-pay | Admitting: *Deleted

## 2018-08-21 ENCOUNTER — Other Ambulatory Visit: Payer: Self-pay

## 2018-08-21 ENCOUNTER — Encounter: Payer: Self-pay | Admitting: *Deleted

## 2018-08-21 ENCOUNTER — Encounter
Admission: RE | Disposition: A | Discharge status: Home / Self Care | Payer: Self-pay | Source: Home / Self Care | Attending: Internal Medicine

## 2018-08-21 ENCOUNTER — Ambulatory Visit: Payer: Medicare Other | Admitting: Anesthesiology

## 2018-08-21 ENCOUNTER — Ambulatory Visit
Admission: RE | Admit: 2018-08-21 | Discharge: 2018-08-21 | Disposition: A | Discharge status: Home / Self Care | Payer: Medicare Other | Attending: Internal Medicine | Admitting: Internal Medicine

## 2018-08-21 DIAGNOSIS — K222 Esophageal obstruction: Secondary | ICD-10-CM | POA: Insufficient documentation

## 2018-08-21 DIAGNOSIS — Z91013 Allergy to seafood: Secondary | ICD-10-CM | POA: Insufficient documentation

## 2018-08-21 DIAGNOSIS — K449 Diaphragmatic hernia without obstruction or gangrene: Secondary | ICD-10-CM | POA: Diagnosis not present

## 2018-08-21 DIAGNOSIS — K210 Gastro-esophageal reflux disease with esophagitis: Secondary | ICD-10-CM | POA: Diagnosis not present

## 2018-08-21 DIAGNOSIS — I10 Essential (primary) hypertension: Secondary | ICD-10-CM | POA: Insufficient documentation

## 2018-08-21 DIAGNOSIS — Z79899 Other long term (current) drug therapy: Secondary | ICD-10-CM | POA: Insufficient documentation

## 2018-08-21 DIAGNOSIS — K3184 Gastroparesis: Secondary | ICD-10-CM | POA: Insufficient documentation

## 2018-08-21 DIAGNOSIS — D500 Iron deficiency anemia secondary to blood loss (chronic): Secondary | ICD-10-CM | POA: Insufficient documentation

## 2018-08-21 HISTORY — PX: GIVENS CAPSULE STUDY: SHX5432

## 2018-08-21 HISTORY — PX: ESOPHAGOGASTRODUODENOSCOPY (EGD) WITH PROPOFOL: SHX5813

## 2018-08-21 SURGERY — ESOPHAGOGASTRODUODENOSCOPY (EGD) WITH PROPOFOL
Anesthesia: General

## 2018-08-21 MED ORDER — GLYCOPYRROLATE 0.2 MG/ML IJ SOLN
INTRAMUSCULAR | Status: DC | PRN
  Administered 2018-08-21: 0.2 mg via INTRAVENOUS

## 2018-08-21 MED ORDER — PROPOFOL 500 MG/50ML IV EMUL
INTRAVENOUS | Status: DC | PRN
  Administered 2018-08-21: 140 ug/kg/min via INTRAVENOUS

## 2018-08-21 MED ORDER — SODIUM CHLORIDE 0.9 % IV SOLN
INTRAVENOUS | Status: DC
  Administered 2018-08-21: 07:00:00 via INTRAVENOUS

## 2018-08-21 MED ORDER — LIDOCAINE HCL (CARDIAC) PF 100 MG/5ML IV SOSY
PREFILLED_SYRINGE | INTRAVENOUS | Status: DC | PRN
  Administered 2018-08-21: 60 mg via INTRAVENOUS

## 2018-08-21 MED ORDER — PROPOFOL 10 MG/ML IV BOLUS
INTRAVENOUS | Status: DC | PRN
  Administered 2018-08-21: 20 mg via INTRAVENOUS
  Administered 2018-08-21: 70 mg via INTRAVENOUS
  Administered 2018-08-21: 30 mg via INTRAVENOUS

## 2018-08-21 NOTE — Transfer of Care (Signed)
Immediate Anesthesia Transfer of Care Note  Patient: ULYSEE FYOCK  Procedure(s) Performed: ESOPHAGOGASTRODUODENOSCOPY (EGD) WITH PROPOFOL (N/A ) GIVENS CAPSULE STUDY (N/A )  Patient Location: PACU  Anesthesia Type:General  Level of Consciousness: sedated  Airway & Oxygen Therapy: Patient Spontanous Breathing and Patient connected to nasal cannula oxygen  Post-op Assessment: Report given to RN and Post -op Vital signs reviewed and stable  Post vital signs: Reviewed and stable  Last Vitals:  Vitals Value Taken Time  BP 132/64 08/21/2018  8:34 AM  Temp 36.3 C 08/21/2018  8:34 AM  Pulse 86 08/21/2018  8:34 AM  Resp 15 08/21/2018  8:34 AM  SpO2 99 % 08/21/2018  8:34 AM  Vitals shown include unvalidated device data.  Last Pain:  Vitals:   08/21/18 0834  TempSrc:   PainSc: 0-No pain         Complications: No apparent anesthesia complications

## 2018-08-21 NOTE — H&P (Signed)
Outpatient short stay form Pre-procedure 08/21/2018 8:13 AM  K. Alice Reichert, M.D.  Primary Physician: N/A  Reason for visit:  Iron deficiency anemia  History of present illness: Patient with IDA presents for endoscopic placement of small bowel video capsule as he has gastroparesis that would not allow natural passage of the capsule.     Current Facility-Administered Medications:  .  0.9 %  sodium chloride infusion, , Intravenous, Continuous, [place], Benay Pike, MD, Last Rate: 20 mL/hr at 08/21/18 1093  Medications Prior to Admission  Medication Sig Dispense Refill Last Dose  . amLODipine (NORVASC) 5 MG tablet Take 5 mg by mouth every evening.   Past Week at 220  . atorvastatin (LIPITOR) 80 MG tablet Take 1 tablet (80 mg total) by mouth daily. (Patient taking differently: Take 80 mg by mouth every evening. ) 90 tablet 4 Past Week at Unknown time  . clopidogrel (PLAVIX) 75 MG tablet Take 1 tablet (75 mg total) by mouth daily with breakfast. (Patient taking differently: Take 75 mg by mouth every evening. ) 90 tablet 4 08/19/2018 at Unknown time  . cyanocobalamin (,VITAMIN B-12,) 1000 MCG/ML injection Inject 1 mL into the muscle every 21 ( twenty-one) days. For 1 month   Past Week at Unknown time  . Iron-Vitamin C (VITRON-C) 65-125 MG TABS Take 1 tablet by mouth daily.   Past Week at Unknown time  . Misc Natural Products (NF FORMULAS TESTOSTERONE) CAPS Take 1 capsule by mouth daily as needed (energy).   Past Week at Unknown time  . Multiple Vitamin (MULTIVITAMIN WITH MINERALS) TABS tablet Take 1 tablet by mouth daily as needed (energy).   Past Week at Unknown time  . tadalafil (CIALIS) 10 MG tablet Take 10 mg by mouth daily as needed for erectile dysfunction.   Past Week at Unknown time     Allergies  Allergen Reactions  . Shellfish Allergy Rash and Swelling     Past Medical History:  Diagnosis Date  . Hypertension     Review of systems:  Otherwise negative.    Physical  Exam  Gen: Alert, oriented. Appears stated age.  HEENT: [place]/AT. PERRLA. Lungs: CTA, no wheezes. CV: RR nl S1, S2. Abd: soft, benign, no masses. BS+ Ext: No edema. Pulses 2+    Planned procedures: Proceed with EGD and VCE placement. The patient understands the nature of the planned procedure, indications, risks, alternatives and potential complications including but not limited to bleeding, infection, perforation, damage to internal organs and possible oversedation/side effects from anesthesia. The patient agrees and gives consent to proceed.  Please refer to procedure notes for findings, recommendations and patient disposition/instructions.      K. Alice Reichert, M.D. Gastroenterology 08/21/2018  8:13 AM
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
## 2018-08-21 NOTE — Anesthesia Post-op Follow-up Note (Signed)
Anesthesia QCDR form completed.        

## 2018-08-21 NOTE — Op Note (Signed)
[facility] Gastroenterology Patient Name: Ryan Harvey Procedure Date: 08/21/2018 6:50 AM MRN: [id number] Account #: [id number] Date of Birth: 1946-03-27 Admit Type: Outpatient Age: 73 Room: [facility] ENDO ROOM 2 Gender: Male Note Status: Finalized Procedure:            Upper GI endoscopy Indications:          Iron deficiency anemia secondary to chronic blood loss,                        Dysphagia, Heartburn, Suspected esophageal reflux,                        Suspected stenosis of the esophagus Providers:            Benay Pike. [place] MD, MD Medicines:            Propofol per Anesthesia Complications:        No immediate complications. Procedure:            Pre-Anesthesia Assessment:                       - The risks and benefits of the procedure and the                        sedation options and risks were discussed with the                        patient. All questions were answered and informed                        consent was obtained.                       - Patient identification and proposed procedure were                        verified prior to the procedure by the nurse. The                        procedure was verified in the procedure room.                       - ASA Grade Assessment: III - A patient with severe                        systemic disease.                       - After reviewing the risks and benefits, the patient                        was deemed in satisfactory condition to undergo the                        procedure.                       After obtaining informed consent, the endoscope was                        passed under direct vision. Throughout the procedure,  the patient's blood pressure, pulse, and oxygen                        saturations were monitored continuously. The Endoscope                        was introduced through the mouth, and advanced to the                        third part of duodenum. The  upper GI endoscopy was                        accomplished without difficulty. The patient tolerated                        the procedure well. Findings:      LA Grade C (one or more mucosal breaks continuous between tops of 2 or       more mucosal folds, less than 75% circumference) esophagitis with no       bleeding was found in the distal esophagus.      One benign-appearing, intrinsic moderate stenosis was found at the       gastroesophageal junction. This stenosis measured 1.3 cm (inner       diameter) x less than one cm (in length). The stenosis was traversed.       Stricture dilated some with scope with some self-limited bleeding.      A 1 cm hiatal hernia was present.      The examined duodenum was normal. Using the endoscope, the video capsule       enteroscope was advanced into the second portion of the duodenum. No       biopsies or other specimens were collected for this exam.      The exam was otherwise without abnormality. Impression:           - LA Grade C reflux esophagitis.                       - Benign-appearing esophageal stenosis.                       - 1 cm hiatal hernia.                       - Normal examined duodenum. No specimens collected.                       - The examination was otherwise normal.                       - Successful completion of the Video Capsule                        Enteroscope placement. Recommendation:       - Patient has a contact number available for                        emergencies. The signs and symptoms of potential                        delayed complications were discussed with the patient.  Return to normal activities tomorrow. Written discharge                        instructions were provided to the patient.                       - Resume previous diet.                       - Continue present medications.                       - Diet as per capsule endoscopy protocol.                       - Use  Protonix (pantoprazole) 40 mg PO daily.                       - Return to my office in 6 weeks.                       - The findings and recommendations were discussed with                        the patient and their family. Procedure Code(s):    --- Professional ---                       [phone], Esophagogastroduodenoscopy, flexible, transoral;                        diagnostic, including collection of specimen(s) by                        brushing or washing, when performed (separate procedure) Diagnosis Code(s):    --- Professional ---                       R12, Heartburn                       D50.0, Iron deficiency anemia secondary to blood loss                        (chronic)                       R13.10, Dysphagia, unspecified                       K44.9, Diaphragmatic hernia without obstruction or                        gangrene                       K22.2, Esophageal obstruction                       K21.0, Gastro-esophageal reflux disease with esophagitis CPT copyright 2018 American Medical Association. All rights reserved. The codes documented in this report are preliminary and upon coder review may  be revised to meet current compliance requirements. Efrain Sella MD, MD 08/21/2018 8:39:36 AM This report has been signed electronically. Number of Addenda: 0 Note Initiated On: 08/21/2018 6:50 AM  [facility]

## 2018-08-22 ENCOUNTER — Encounter: Payer: Self-pay | Admitting: Internal Medicine

## 2018-08-23 ENCOUNTER — Ambulatory Visit: Payer: Medicare Other

## 2018-08-24 NOTE — Anesthesia Postprocedure Evaluation (Signed)
Anesthesia Post Note  Patient: Ryan Harvey  Procedure(s) Performed: ESOPHAGOGASTRODUODENOSCOPY (EGD) WITH PROPOFOL (N/A ) GIVENS CAPSULE STUDY (N/A )  Patient location during evaluation: PACU Anesthesia Type: General Level of consciousness: awake and alert Pain management: pain level controlled Vital Signs Assessment: post-procedure vital signs reviewed and stable Respiratory status: spontaneous breathing, nonlabored ventilation, respiratory function stable and patient connected to nasal cannula oxygen Cardiovascular status: blood pressure returned to baseline and stable Postop Assessment: no apparent nausea or vomiting Anesthetic complications: no     Last Vitals:  Vitals:   08/21/18 0833 08/21/18 0834  BP: 132/64 132/64  Pulse:  87  Resp: 16 14  Temp: (!) 36.3 C (!) 36.3 C  SpO2:  99%    Last Pain:  Vitals:   08/21/18 0911  TempSrc:   PainSc: 0-No pain                 Molli Barrows

## 2018-09-10 ENCOUNTER — Ambulatory Visit: Payer: Medicare Other | Admitting: Oncology

## 2018-09-10 ENCOUNTER — Other Ambulatory Visit: Payer: Medicare Other

## 2018-10-04 ENCOUNTER — Other Ambulatory Visit: Payer: Self-pay | Admitting: *Deleted

## 2018-10-04 DIAGNOSIS — D500 Iron deficiency anemia secondary to blood loss (chronic): Secondary | ICD-10-CM

## 2018-10-07 ENCOUNTER — Other Ambulatory Visit: Payer: Medicare Other

## 2018-10-07 ENCOUNTER — Ambulatory Visit: Payer: Medicare Other | Admitting: Oncology

## 2018-12-02 ENCOUNTER — Inpatient Hospital Stay: Payer: Medicare Other | Admitting: Oncology

## 2018-12-02 ENCOUNTER — Inpatient Hospital Stay: Payer: Medicare Other

## 2018-12-09 ENCOUNTER — Inpatient Hospital Stay: Payer: Medicare Other | Attending: Oncology

## 2018-12-09 ENCOUNTER — Inpatient Hospital Stay: Payer: Medicare Other | Admitting: Oncology

## 2019-02-12 ENCOUNTER — Telehealth: Payer: Self-pay | Admitting: Oncology

## 2019-02-12 NOTE — Telephone Encounter (Signed)
L/M to try and reschedule appt from N/S

## 2019-10-13 DIAGNOSIS — I2510 Atherosclerotic heart disease of native coronary artery without angina pectoris: Secondary | ICD-10-CM | POA: Diagnosis not present

## 2019-10-13 DIAGNOSIS — E782 Mixed hyperlipidemia: Secondary | ICD-10-CM | POA: Diagnosis not present

## 2019-10-13 DIAGNOSIS — I10 Essential (primary) hypertension: Secondary | ICD-10-CM | POA: Diagnosis not present

## 2020-02-07 ENCOUNTER — Emergency Department
Admission: EM | Admit: 2020-02-07 | Discharge: 2020-02-07 | Disposition: A | Discharge status: Home / Self Care | Payer: Medicare HMO | Attending: Emergency Medicine | Admitting: Emergency Medicine

## 2020-02-07 ENCOUNTER — Other Ambulatory Visit: Payer: Self-pay

## 2020-02-07 ENCOUNTER — Emergency Department: Payer: Medicare HMO

## 2020-02-07 ENCOUNTER — Encounter: Payer: Self-pay | Admitting: Intensive Care

## 2020-02-07 DIAGNOSIS — R05 Cough: Secondary | ICD-10-CM | POA: Diagnosis not present

## 2020-02-07 DIAGNOSIS — R531 Weakness: Secondary | ICD-10-CM | POA: Insufficient documentation

## 2020-02-07 DIAGNOSIS — Z8616 Personal history of COVID-19: Secondary | ICD-10-CM | POA: Insufficient documentation

## 2020-02-07 DIAGNOSIS — Z5321 Procedure and treatment not carried out due to patient leaving prior to being seen by health care provider: Secondary | ICD-10-CM | POA: Insufficient documentation

## 2020-02-07 DIAGNOSIS — R0602 Shortness of breath: Secondary | ICD-10-CM | POA: Insufficient documentation

## 2020-02-07 DIAGNOSIS — J90 Pleural effusion, not elsewhere classified: Secondary | ICD-10-CM | POA: Diagnosis not present

## 2020-02-07 HISTORY — DX: Hyperlipidemia, unspecified: E78.5

## 2020-02-07 LAB — CBC
HCT: 44.5 % (ref 39.0–52.0)
Hemoglobin: 13.2 g/dL (ref 13.0–17.0)
MCH: 24.8 pg — ABNORMAL LOW (ref 26.0–34.0)
MCHC: 29.7 g/dL — ABNORMAL LOW (ref 30.0–36.0)
MCV: 83.5 fL (ref 80.0–100.0)
Platelets: 262 10*3/uL (ref 150–400)
RBC: 5.33 MIL/uL (ref 4.22–5.81)
RDW: 14.2 % (ref 11.5–15.5)
WBC: 9.4 10*3/uL (ref 4.0–10.5)
nRBC: 0 % (ref 0.0–0.2)

## 2020-02-07 LAB — PROTIME-INR
INR: 1 (ref 0.8–1.2)
Prothrombin Time: 12.4 seconds (ref 11.4–15.2)

## 2020-02-07 LAB — BASIC METABOLIC PANEL
Anion gap: 11 (ref 5–15)
BUN: 9 mg/dL (ref 8–23)
CO2: 25 mmol/L (ref 22–32)
Calcium: 9.2 mg/dL (ref 8.9–10.3)
Chloride: 100 mmol/L (ref 98–111)
Creatinine, Ser: 1.09 mg/dL (ref 0.61–1.24)
GFR calc Af Amer: >60 mL/min (ref >60)
GFR calc non Af Amer: >60 mL/min (ref >60)
Glucose, Bld: 93 mg/dL (ref 70–99)
Potassium: 4.1 mmol/L (ref 3.5–5.1)
Sodium: 136 mmol/L (ref 135–145)

## 2020-02-07 LAB — TROPONIN I (HIGH SENSITIVITY): Troponin I (High Sensitivity): 4 ng/L (ref <18)

## 2020-02-07 NOTE — ED Triage Notes (Addendum)
Patient c/o cough, sob when ambulatory, weakness, head pain when coughing X1 month. Reports symptoms are worse within last week. Patient positive for covid February 2021. Received 1st  Pf[employer] vaccine 09/16/19 and 2nd 10/07/19

## 2020-02-07 NOTE — ED Notes (Signed)
NA when called for repeat trop and vitals

## 2020-02-07 NOTE — ED Notes (Signed)
Na when called for repeat trop and vitals

## 2020-02-12 DIAGNOSIS — E785 Hyperlipidemia, unspecified: Secondary | ICD-10-CM | POA: Diagnosis not present

## 2020-02-12 DIAGNOSIS — I10 Essential (primary) hypertension: Secondary | ICD-10-CM | POA: Diagnosis not present

## 2020-02-12 DIAGNOSIS — K5900 Constipation, unspecified: Secondary | ICD-10-CM | POA: Diagnosis not present

## 2020-02-12 DIAGNOSIS — R7989 Other specified abnormal findings of blood chemistry: Secondary | ICD-10-CM | POA: Insufficient documentation

## 2020-02-12 DIAGNOSIS — C3490 Malignant neoplasm of unspecified part of unspecified bronchus or lung: Secondary | ICD-10-CM | POA: Diagnosis not present

## 2020-02-12 DIAGNOSIS — C801 Malignant (primary) neoplasm, unspecified: Secondary | ICD-10-CM | POA: Diagnosis not present

## 2020-02-12 DIAGNOSIS — R918 Other nonspecific abnormal finding of lung field: Secondary | ICD-10-CM | POA: Diagnosis not present

## 2020-02-12 DIAGNOSIS — J209 Acute bronchitis, unspecified: Secondary | ICD-10-CM | POA: Diagnosis not present

## 2020-02-12 DIAGNOSIS — J189 Pneumonia, unspecified organism: Secondary | ICD-10-CM | POA: Insufficient documentation

## 2020-02-12 DIAGNOSIS — J910 Malignant pleural effusion: Secondary | ICD-10-CM | POA: Diagnosis not present

## 2020-02-12 DIAGNOSIS — R0602 Shortness of breath: Secondary | ICD-10-CM | POA: Diagnosis not present

## 2020-02-12 DIAGNOSIS — R079 Chest pain, unspecified: Secondary | ICD-10-CM | POA: Diagnosis not present

## 2020-02-12 DIAGNOSIS — C3431 Malignant neoplasm of lower lobe, right bronchus or lung: Secondary | ICD-10-CM | POA: Diagnosis not present

## 2020-02-12 DIAGNOSIS — J9811 Atelectasis: Secondary | ICD-10-CM | POA: Diagnosis not present

## 2020-02-12 DIAGNOSIS — Z8616 Personal history of COVID-19: Secondary | ICD-10-CM | POA: Diagnosis not present

## 2020-02-12 DIAGNOSIS — J90 Pleural effusion, not elsewhere classified: Secondary | ICD-10-CM | POA: Insufficient documentation

## 2020-02-12 DIAGNOSIS — I2510 Atherosclerotic heart disease of native coronary artery without angina pectoris: Secondary | ICD-10-CM | POA: Diagnosis not present

## 2020-02-12 DIAGNOSIS — Z20822 Contact with and (suspected) exposure to covid-19: Secondary | ICD-10-CM | POA: Diagnosis not present

## 2020-02-12 DIAGNOSIS — K7689 Other specified diseases of liver: Secondary | ICD-10-CM | POA: Diagnosis not present

## 2020-02-13 DIAGNOSIS — J189 Pneumonia, unspecified organism: Secondary | ICD-10-CM | POA: Diagnosis not present

## 2020-02-13 DIAGNOSIS — Z8616 Personal history of COVID-19: Secondary | ICD-10-CM | POA: Diagnosis not present

## 2020-02-13 DIAGNOSIS — I10 Essential (primary) hypertension: Secondary | ICD-10-CM | POA: Diagnosis not present

## 2020-02-13 DIAGNOSIS — J90 Pleural effusion, not elsewhere classified: Secondary | ICD-10-CM | POA: Diagnosis not present

## 2020-02-14 DIAGNOSIS — I10 Essential (primary) hypertension: Secondary | ICD-10-CM | POA: Diagnosis not present

## 2020-02-14 DIAGNOSIS — Z8616 Personal history of COVID-19: Secondary | ICD-10-CM | POA: Diagnosis not present

## 2020-02-14 DIAGNOSIS — J189 Pneumonia, unspecified organism: Secondary | ICD-10-CM | POA: Diagnosis not present

## 2020-02-15 DIAGNOSIS — I10 Essential (primary) hypertension: Secondary | ICD-10-CM | POA: Diagnosis not present

## 2020-02-15 DIAGNOSIS — J90 Pleural effusion, not elsewhere classified: Secondary | ICD-10-CM | POA: Diagnosis not present

## 2020-02-15 DIAGNOSIS — J189 Pneumonia, unspecified organism: Secondary | ICD-10-CM | POA: Diagnosis not present

## 2020-02-15 DIAGNOSIS — Z8616 Personal history of COVID-19: Secondary | ICD-10-CM | POA: Diagnosis not present

## 2020-02-16 DIAGNOSIS — Z8616 Personal history of COVID-19: Secondary | ICD-10-CM | POA: Diagnosis not present

## 2020-02-16 DIAGNOSIS — I10 Essential (primary) hypertension: Secondary | ICD-10-CM | POA: Diagnosis not present

## 2020-02-16 DIAGNOSIS — J189 Pneumonia, unspecified organism: Secondary | ICD-10-CM | POA: Diagnosis not present

## 2020-02-17 DIAGNOSIS — J189 Pneumonia, unspecified organism: Secondary | ICD-10-CM | POA: Diagnosis not present

## 2020-02-17 DIAGNOSIS — I10 Essential (primary) hypertension: Secondary | ICD-10-CM | POA: Diagnosis not present

## 2020-02-17 DIAGNOSIS — C3490 Malignant neoplasm of unspecified part of unspecified bronchus or lung: Secondary | ICD-10-CM | POA: Diagnosis not present

## 2020-02-17 DIAGNOSIS — J90 Pleural effusion, not elsewhere classified: Secondary | ICD-10-CM | POA: Diagnosis not present

## 2020-02-17 DIAGNOSIS — Z8616 Personal history of COVID-19: Secondary | ICD-10-CM | POA: Diagnosis not present

## 2020-02-18 DIAGNOSIS — I10 Essential (primary) hypertension: Secondary | ICD-10-CM | POA: Diagnosis not present

## 2020-02-18 DIAGNOSIS — J189 Pneumonia, unspecified organism: Secondary | ICD-10-CM | POA: Diagnosis not present

## 2020-02-18 DIAGNOSIS — Z8616 Personal history of COVID-19: Secondary | ICD-10-CM | POA: Diagnosis not present

## 2020-02-18 DIAGNOSIS — J90 Pleural effusion, not elsewhere classified: Secondary | ICD-10-CM | POA: Diagnosis not present

## 2020-02-18 DIAGNOSIS — C3490 Malignant neoplasm of unspecified part of unspecified bronchus or lung: Secondary | ICD-10-CM | POA: Diagnosis not present

## 2020-02-25 DIAGNOSIS — E782 Mixed hyperlipidemia: Secondary | ICD-10-CM | POA: Diagnosis not present

## 2020-02-25 DIAGNOSIS — Z09 Encounter for follow-up examination after completed treatment for conditions other than malignant neoplasm: Secondary | ICD-10-CM | POA: Diagnosis not present

## 2020-02-25 DIAGNOSIS — R918 Other nonspecific abnormal finding of lung field: Secondary | ICD-10-CM | POA: Diagnosis not present

## 2020-02-25 DIAGNOSIS — R0789 Other chest pain: Secondary | ICD-10-CM | POA: Diagnosis not present

## 2020-02-25 DIAGNOSIS — I10 Essential (primary) hypertension: Secondary | ICD-10-CM | POA: Diagnosis not present

## 2020-02-25 DIAGNOSIS — D500 Iron deficiency anemia secondary to blood loss (chronic): Secondary | ICD-10-CM | POA: Diagnosis not present

## 2020-02-25 DIAGNOSIS — J90 Pleural effusion, not elsewhere classified: Secondary | ICD-10-CM | POA: Diagnosis not present

## 2020-03-01 ENCOUNTER — Encounter: Payer: Self-pay | Admitting: Oncology

## 2020-03-01 ENCOUNTER — Inpatient Hospital Stay: Payer: Medicare HMO | Attending: Oncology | Admitting: Oncology

## 2020-03-01 ENCOUNTER — Other Ambulatory Visit: Payer: Self-pay

## 2020-03-01 VITALS — BP 115/58 | HR 87 | Temp 96.8°F | Wt 170.5 lb

## 2020-03-01 DIAGNOSIS — I10 Essential (primary) hypertension: Secondary | ICD-10-CM | POA: Diagnosis not present

## 2020-03-01 DIAGNOSIS — D509 Iron deficiency anemia, unspecified: Secondary | ICD-10-CM | POA: Insufficient documentation

## 2020-03-01 DIAGNOSIS — C3431 Malignant neoplasm of lower lobe, right bronchus or lung: Secondary | ICD-10-CM | POA: Diagnosis not present

## 2020-03-01 DIAGNOSIS — Z7189 Other specified counseling: Secondary | ICD-10-CM

## 2020-03-01 DIAGNOSIS — R5381 Other malaise: Secondary | ICD-10-CM | POA: Diagnosis not present

## 2020-03-01 DIAGNOSIS — R5383 Other fatigue: Secondary | ICD-10-CM | POA: Insufficient documentation

## 2020-03-01 DIAGNOSIS — Z5111 Encounter for antineoplastic chemotherapy: Secondary | ICD-10-CM | POA: Insufficient documentation

## 2020-03-01 DIAGNOSIS — E785 Hyperlipidemia, unspecified: Secondary | ICD-10-CM | POA: Diagnosis not present

## 2020-03-01 DIAGNOSIS — Z7952 Long term (current) use of systemic steroids: Secondary | ICD-10-CM | POA: Insufficient documentation

## 2020-03-01 DIAGNOSIS — Z79899 Other long term (current) drug therapy: Secondary | ICD-10-CM | POA: Diagnosis not present

## 2020-03-01 DIAGNOSIS — J910 Malignant pleural effusion: Secondary | ICD-10-CM | POA: Diagnosis not present

## 2020-03-01 DIAGNOSIS — Z5112 Encounter for antineoplastic immunotherapy: Secondary | ICD-10-CM | POA: Diagnosis not present

## 2020-03-01 DIAGNOSIS — Z87891 Personal history of nicotine dependence: Secondary | ICD-10-CM | POA: Insufficient documentation

## 2020-03-01 DIAGNOSIS — R918 Other nonspecific abnormal finding of lung field: Secondary | ICD-10-CM | POA: Diagnosis not present

## 2020-03-02 ENCOUNTER — Telehealth: Payer: Self-pay | Admitting: *Deleted

## 2020-03-02 ENCOUNTER — Encounter: Payer: Self-pay | Admitting: Oncology

## 2020-03-02 DIAGNOSIS — Z7189 Other specified counseling: Secondary | ICD-10-CM | POA: Insufficient documentation

## 2020-03-02 DIAGNOSIS — J910 Malignant pleural effusion: Secondary | ICD-10-CM | POA: Insufficient documentation
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
## 2020-03-02 NOTE — Telephone Encounter (Signed)
Attempted to contact pt to review appt details for brain MRI scheduled on 9/15 at 4pm. Pt did not answer after 2 attempts to contact him. Message left with appt details. Will follow up again tomorrow morning to confirm pt received the message.

## 2020-03-03 ENCOUNTER — Other Ambulatory Visit: Payer: Self-pay

## 2020-03-03 ENCOUNTER — Ambulatory Visit (HOSPITAL_COMMUNITY)
Admission: RE | Admit: 2020-03-03 | Discharge: 2020-03-03 | Disposition: A | Discharge status: Home / Self Care | Payer: Medicare HMO | Source: Ambulatory Visit | Attending: Oncology | Admitting: Oncology

## 2020-03-03 ENCOUNTER — Encounter: Payer: Self-pay | Admitting: *Deleted

## 2020-03-03 DIAGNOSIS — C3490 Malignant neoplasm of unspecified part of unspecified bronchus or lung: Secondary | ICD-10-CM | POA: Insufficient documentation

## 2020-03-03 DIAGNOSIS — J910 Malignant pleural effusion: Secondary | ICD-10-CM | POA: Diagnosis not present

## 2020-03-03 MED ORDER — GADOBUTROL 1 MMOL/ML IV SOLN
8.0000 mL | Freq: Once | INTRAVENOUS | Status: AC | PRN
  Administered 2020-03-03: 8 mL via INTRAVENOUS

## 2020-03-03 NOTE — Progress Notes (Signed)
  Oncology Nurse Navigator Documentation  Navigator Location: [facility] (03/03/20 1000)   )Navigator Encounter Type: Introductory Phone Call (03/03/20 1000)   Abnormal Finding Date: 02/12/20 (03/03/20 1000) Confirmed Diagnosis Date: 02/16/20 (03/03/20 1000)                   Barriers/Navigation Needs: Coordination of Care;Work Conflicts (53/00/51 1021)   Interventions: Coordination of Care (03/03/20 1000)   Coordination of Care: Appts;Pathology;Radiology (03/03/20 1000)        Acuity: Level 2-Minimal Needs (1-2 Barriers Identified) (03/03/20 1000)    phone call made to patient to introduce to navigator services. All questions answered during call. Reviewed upcoming appts. Contact info given and instructed to call with any further questions or needs. Nothing further needed at this time.     Time Spent with Patient: 45 (03/03/20 1000)

## 2020-03-08 ENCOUNTER — Ambulatory Visit (HOSPITAL_COMMUNITY): Admission: RE | Admit: 2020-03-08 | Payer: Medicare HMO | Source: Ambulatory Visit

## 2020-03-08 ENCOUNTER — Other Ambulatory Visit: Payer: Self-pay

## 2020-03-08 ENCOUNTER — Encounter (HOSPITAL_COMMUNITY): Payer: Self-pay

## 2020-03-09 ENCOUNTER — Telehealth: Payer: Self-pay | Admitting: Oncology

## 2020-03-09 ENCOUNTER — Inpatient Hospital Stay: Payer: Medicare HMO | Admitting: Oncology

## 2020-03-09 NOTE — Telephone Encounter (Signed)
Appt with MD cancelled and rescheduled to 03-12-20 to be after patient's PET scan.

## 2020-03-11 ENCOUNTER — Encounter (HOSPITAL_COMMUNITY)
Admission: RE | Admit: 2020-03-11 | Discharge: 2020-03-11 | Disposition: A | Discharge status: Home / Self Care | Payer: Medicare HMO | Source: Ambulatory Visit | Attending: Oncology | Admitting: Oncology

## 2020-03-11 ENCOUNTER — Other Ambulatory Visit: Payer: Self-pay

## 2020-03-11 DIAGNOSIS — I700 Atherosclerosis of aorta: Secondary | ICD-10-CM | POA: Insufficient documentation

## 2020-03-11 DIAGNOSIS — C782 Secondary malignant neoplasm of pleura: Secondary | ICD-10-CM | POA: Diagnosis not present

## 2020-03-11 DIAGNOSIS — R9389 Abnormal findings on diagnostic imaging of other specified body structures: Secondary | ICD-10-CM | POA: Insufficient documentation

## 2020-03-11 DIAGNOSIS — D492 Neoplasm of unspecified behavior of bone, soft tissue, and skin: Secondary | ICD-10-CM | POA: Diagnosis not present

## 2020-03-11 DIAGNOSIS — N400 Enlarged prostate without lower urinary tract symptoms: Secondary | ICD-10-CM | POA: Diagnosis not present

## 2020-03-11 DIAGNOSIS — S2231XA Fracture of one rib, right side, initial encounter for closed fracture: Secondary | ICD-10-CM | POA: Diagnosis not present

## 2020-03-11 DIAGNOSIS — J910 Malignant pleural effusion: Secondary | ICD-10-CM | POA: Diagnosis not present

## 2020-03-11 LAB — GLUCOSE, CAPILLARY: Glucose-Capillary: 111 mg/dL — ABNORMAL HIGH (ref 70–99)

## 2020-03-11 MED ORDER — FLUDEOXYGLUCOSE F - 18 (FDG) INJECTION
8.3000 | Freq: Once | INTRAVENOUS | Status: AC | PRN
  Administered 2020-03-11: 8.3 via INTRAVENOUS

## 2020-03-12 ENCOUNTER — Encounter: Payer: Self-pay | Admitting: *Deleted

## 2020-03-12 ENCOUNTER — Encounter: Payer: Self-pay | Admitting: Oncology

## 2020-03-12 ENCOUNTER — Other Ambulatory Visit: Payer: Self-pay | Admitting: *Deleted

## 2020-03-12 ENCOUNTER — Inpatient Hospital Stay (HOSPITAL_BASED_OUTPATIENT_CLINIC_OR_DEPARTMENT_OTHER): Payer: Medicare HMO | Admitting: Oncology

## 2020-03-12 VITALS — BP 137/80 | HR 91 | Temp 96.3°F | Resp 16 | Wt 167.1 lb

## 2020-03-12 DIAGNOSIS — Z7189 Other specified counseling: Secondary | ICD-10-CM | POA: Diagnosis not present

## 2020-03-12 DIAGNOSIS — E785 Hyperlipidemia, unspecified: Secondary | ICD-10-CM | POA: Diagnosis not present

## 2020-03-12 DIAGNOSIS — D509 Iron deficiency anemia, unspecified: Secondary | ICD-10-CM | POA: Diagnosis not present

## 2020-03-12 DIAGNOSIS — R5381 Other malaise: Secondary | ICD-10-CM | POA: Diagnosis not present

## 2020-03-12 DIAGNOSIS — R5383 Other fatigue: Secondary | ICD-10-CM | POA: Diagnosis not present

## 2020-03-12 DIAGNOSIS — C3431 Malignant neoplasm of lower lobe, right bronchus or lung: Secondary | ICD-10-CM | POA: Diagnosis not present

## 2020-03-12 DIAGNOSIS — C3491 Malignant neoplasm of unspecified part of right bronchus or lung: Secondary | ICD-10-CM | POA: Insufficient documentation

## 2020-03-12 DIAGNOSIS — I10 Essential (primary) hypertension: Secondary | ICD-10-CM | POA: Diagnosis not present

## 2020-03-12 DIAGNOSIS — J910 Malignant pleural effusion: Secondary | ICD-10-CM

## 2020-03-12 DIAGNOSIS — Z5112 Encounter for antineoplastic immunotherapy: Secondary | ICD-10-CM | POA: Diagnosis not present

## 2020-03-12 DIAGNOSIS — Z5111 Encounter for antineoplastic chemotherapy: Secondary | ICD-10-CM | POA: Diagnosis not present

## 2020-03-12 MED ORDER — FOLIC ACID 1 MG PO TABS: 1.0000 mg | ORAL_TABLET | Freq: Every day | ORAL | 3 refills | Status: DC

## 2020-03-12 MED ORDER — OXYCODONE HCL 5 MG PO TABS: 5.0000 mg | ORAL_TABLET | Freq: Four times a day (QID) | ORAL | 0 refills | Status: DC | PRN

## 2020-03-12 MED ORDER — ONDANSETRON HCL 8 MG PO TABS: 8.0000 mg | ORAL_TABLET | Freq: Two times a day (BID) | ORAL | 1 refills | Status: DC | PRN

## 2020-03-12 MED ORDER — PROCHLORPERAZINE MALEATE 10 MG PO TABS: 10.0000 mg | ORAL_TABLET | Freq: Four times a day (QID) | ORAL | 1 refills | Status: DC | PRN

## 2020-03-12 MED ORDER — LIDOCAINE-PRILOCAINE 2.5-2.5 % EX CREA: TOPICAL_CREAM | CUTANEOUS | 3 refills | Status: DC

## 2020-03-12 MED ORDER — DEXAMETHASONE 4 MG PO TABS: ORAL_TABLET | ORAL | 1 refills | Status: DC

## 2020-03-12 NOTE — Patient Instructions (Signed)
Pembrolizumab injection What is this medicine? PEMBROLIZUMAB (pem broe liz ue mab) is a monoclonal antibody. It is used to treat certain types of cancer. This medicine may be used for other purposes; ask your health care provider or pharmacist if you have questions. COMMON BRAND NAME(S): Keytruda What should I tell my health care provider before I take this medicine? They need to know if you have any of these conditions:  diabetes  immune system problems  inflammatory bowel disease  liver disease  lung or breathing disease  lupus  received or scheduled to receive an organ transplant or a stem-cell transplant that uses donor stem cells  an unusual or allergic reaction to pembrolizumab, other medicines, foods, dyes, or preservatives  pregnant or trying to get pregnant  breast-feeding How should I use this medicine? This medicine is for infusion into a vein. It is given by a health care professional in a hospital or clinic setting. A special MedGuide will be given to you before each treatment. Be sure to read this information carefully each time. Talk to your pediatrician regarding the use of this medicine in children. While this drug may be prescribed for children as young as 6 months for selected conditions, precautions do apply. Overdosage: If you think you have taken too much of this medicine contact a poison control center or emergency room at once. NOTE: This medicine is only for you. Do not share this medicine with others. What if I miss a dose? It is important not to miss your dose. Call your doctor or health care professional if you are unable to keep an appointment. What may interact with this medicine? Interactions have not been studied. Give your health care provider a list of all the medicines, herbs, non-prescription drugs, or dietary supplements you use. Also tell them if you smoke, drink alcohol, or use illegal drugs. Some items may interact with your medicine. This  list may not describe all possible interactions. Give your health care provider a list of all the medicines, herbs, non-prescription drugs, or dietary supplements you use. Also tell them if you smoke, drink alcohol, or use illegal drugs. Some items may interact with your medicine. What should I watch for while using this medicine? Your condition will be monitored carefully while you are receiving this medicine. You may need blood work done while you are taking this medicine. Do not become pregnant while taking this medicine or for 4 months after stopping it. Women should inform their doctor if they wish to become pregnant or think they might be pregnant. There is a potential for serious side effects to an unborn child. Talk to your health care professional or pharmacist for more information. Do not breast-feed an infant while taking this medicine or for 4 months after the last dose. What side effects may I notice from receiving this medicine? Side effects that you should report to your doctor or health care professional as soon as possible:  allergic reactions like skin rash, itching or hives, swelling of the face, lips, or tongue  bloody or black, tarry  breathing problems  changes in vision  chest pain  chills  confusion  constipation  cough  diarrhea  dizziness or feeling faint or lightheaded  fast or irregular heartbeat  fever  flushing  joint pain  low blood counts - this medicine may decrease the number of white blood cells, red blood cells and platelets. You may be at increased risk for infections and bleeding.  muscle pain  muscle   weakness  pain, tingling, numbness in the hands or feet  persistent headache  redness, blistering, peeling or loosening of the skin, including inside the mouth  signs and symptoms of high blood sugar such as dizziness; dry mouth; dry skin; fruity breath; nausea; stomach pain; increased hunger or thirst; increased urination  signs  and symptoms of kidney injury like trouble passing urine or change in the amount of urine  signs and symptoms of liver injury like dark urine, light-colored stools, loss of appetite, nausea, right upper belly pain, yellowing of the eyes or skin  sweating  swollen lymph nodes  weight loss Side effects that usually do not require medical attention (report to your doctor or health care professional if they continue or are bothersome):  decreased appetite  hair loss  muscle pain  tiredness This list may not describe all possible side effects. Call your doctor for medical advice about side effects. You may report side effects to FDA at 1-800-FDA-1088. Where should I keep my medicine? This drug is given in a hospital or clinic and will not be stored at home. NOTE: This sheet is a summary. It may not cover all possible information. If you have questions about this medicine, talk to your doctor, pharmacist, or health care provider.  2020 Elsevier/Gold Standard (2019-04-11 18:07:58) Pemetrexed injection What is this medicine? PEMETREXED (PEM e TREX ed) is a chemotherapy drug used to treat lung cancers like non-small cell lung cancer and mesothelioma. It may also be used to treat other cancers. This medicine may be used for other purposes; ask your health care provider or pharmacist if you have questions. COMMON BRAND NAME(S): Alimta What should I tell my health care provider before I take this medicine? They need to know if you have any of these conditions:  infection (especially a virus infection such as chickenpox, cold sores, or herpes)  kidney disease  low blood counts, like low white cell, platelet, or red cell counts  lung or breathing disease, like asthma  radiation therapy  an unusual or allergic reaction to pemetrexed, other medicines, foods, dyes, or preservative  pregnant or trying to get pregnant  breast-feeding How should I use this medicine? This drug is given as  an infusion into a vein. It is administered in a hospital or clinic by a specially trained health care professional. Talk to your pediatrician regarding the use of this medicine in children. Special care may be needed. Overdosage: If you think you have taken too much of this medicine contact a poison control center or emergency room at once. NOTE: This medicine is only for you. Do not share this medicine with others. What if I miss a dose? It is important not to miss your dose. Call your doctor or health care professional if you are unable to keep an appointment. What may interact with this medicine? This medicine may interact with the following medications:  Ibuprofen This list may not describe all possible interactions. Give your health care provider a list of all the medicines, herbs, non-prescription drugs, or dietary supplements you use. Also tell them if you smoke, drink alcohol, or use illegal drugs. Some items may interact with your medicine. What should I watch for while using this medicine? Visit your doctor for checks on your progress. This drug may make you feel generally unwell. This is not uncommon, as chemotherapy can affect healthy cells as well as cancer cells. Report any side effects. Continue your course of treatment even though you feel ill unless   your doctor tells you to stop. In some cases, you may be given additional medicines to help with side effects. Follow all directions for their use. Call your doctor or health care professional for advice if you get a fever, chills or sore throat, or other symptoms of a cold or flu. Do not treat yourself. This drug decreases your body's ability to fight infections. Try to avoid being around people who are sick. This medicine may increase your risk to bruise or bleed. Call your doctor or health care professional if you notice any unusual bleeding. Be careful brushing and flossing your teeth or using a toothpick because you may get an  infection or bleed more easily. If you have any dental work done, tell your dentist you are receiving this medicine. Avoid taking products that contain aspirin, acetaminophen, ibuprofen, naproxen, or ketoprofen unless instructed by your doctor. These medicines may hide a fever. Call your doctor or health care professional if you get diarrhea or mouth sores. Do not treat yourself. To protect your kidneys, drink water or other fluids as directed while you are taking this medicine. Do not become pregnant while taking this medicine or for 6 months after stopping it. Women should inform their doctor if they wish to become pregnant or think they might be pregnant. Men should not father a child while taking this medicine and for 3 months after stopping it. This may interfere with the ability to father a child. You should talk to your doctor or health care professional if you are concerned about your fertility. There is a potential for serious side effects to an unborn child. Talk to your health care professional or pharmacist for more information. Do not breast-feed an infant while taking this medicine or for 1 week after stopping it. What side effects may I notice from receiving this medicine? Side effects that you should report to your doctor or health care professional as soon as possible:  allergic reactions like skin rash, itching or hives, swelling of the face, lips, or tongue  breathing problems  redness, blistering, peeling or loosening of the skin, including inside the mouth  signs and symptoms of bleeding such as bloody or black, tarry stools; red or dark-brown urine; spitting up blood or brown material that looks like coffee grounds; red spots on the skin; unusual bruising or bleeding from the eye, gums, or nose  signs and symptoms of infection like fever or chills; cough; sore throat; pain or trouble passing urine  signs and symptoms of kidney injury like trouble passing urine or change in the  amount of urine  signs and symptoms of liver injury like dark yellow or brown urine; general ill feeling or flu-like symptoms; light-colored stools; loss of appetite; nausea; right upper belly pain; unusually weak or tired; yellowing of the eyes or skin Side effects that usually do not require medical attention (report to your doctor or health care professional if they continue or are bothersome):  constipation  mouth sores  nausea, vomiting  unusually weak or tired This list may not describe all possible side effects. Call your doctor for medical advice about side effects. You may report side effects to FDA at 1-800-FDA-1088. Where should I keep my medicine? This drug is given in a hospital or clinic and will not be stored at home. NOTE: This sheet is a summary. It may not cover all possible information. If you have questions about this medicine, talk to your doctor, pharmacist, or health care provider.  2020   Elsevier/Gold Standard (2017-07-25 16:11:33) Carboplatin injection What is this medicine? CARBOPLATIN (KAR boe pla tin) is a chemotherapy drug. It targets fast dividing cells, like cancer cells, and causes these cells to die. This medicine is used to treat ovarian cancer and many other cancers. This medicine may be used for other purposes; ask your health care provider or pharmacist if you have questions. COMMON BRAND NAME(S): Paraplatin What should I tell my health care provider before I take this medicine? They need to know if you have any of these conditions:  blood disorders  hearing problems  kidney disease  recent or ongoing radiation therapy  an unusual or allergic reaction to carboplatin, cisplatin, other chemotherapy, other medicines, foods, dyes, or preservatives  pregnant or trying to get pregnant  breast-feeding How should I use this medicine? This drug is usually given as an infusion into a vein. It is administered in a hospital or clinic by a specially  trained health care professional. Talk to your pediatrician regarding the use of this medicine in children. Special care may be needed. Overdosage: If you think you have taken too much of this medicine contact a poison control center or emergency room at once. NOTE: This medicine is only for you. Do not share this medicine with others. What if I miss a dose? It is important not to miss a dose. Call your doctor or health care professional if you are unable to keep an appointment. What may interact with this medicine?  medicines for seizures  medicines to increase blood counts like filgrastim, pegfilgrastim, sargramostim  some antibiotics like amikacin, gentamicin, neomycin, streptomycin, tobramycin  vaccines Talk to your doctor or health care professional before taking any of these medicines:  acetaminophen  aspirin  ibuprofen  ketoprofen  naproxen This list may not describe all possible interactions. Give your health care provider a list of all the medicines, herbs, non-prescription drugs, or dietary supplements you use. Also tell them if you smoke, drink alcohol, or use illegal drugs. Some items may interact with your medicine. What should I watch for while using this medicine? Your condition will be monitored carefully while you are receiving this medicine. You will need important blood work done while you are taking this medicine. This drug may make you feel generally unwell. This is not uncommon, as chemotherapy can affect healthy cells as well as cancer cells. Report any side effects. Continue your course of treatment even though you feel ill unless your doctor tells you to stop. In some cases, you may be given additional medicines to help with side effects. Follow all directions for their use. Call your doctor or health care professional for advice if you get a fever, chills or sore throat, or other symptoms of a cold or flu. Do not treat yourself. This drug decreases your body's  ability to fight infections. Try to avoid being around people who are sick. This medicine may increase your risk to bruise or bleed. Call your doctor or health care professional if you notice any unusual bleeding. Be careful brushing and flossing your teeth or using a toothpick because you may get an infection or bleed more easily. If you have any dental work done, tell your dentist you are receiving this medicine. Avoid taking products that contain aspirin, acetaminophen, ibuprofen, naproxen, or ketoprofen unless instructed by your doctor. These medicines may hide a fever. Do not become pregnant while taking this medicine. Women should inform their doctor if they wish to become pregnant or think they might   be pregnant. There is a potential for serious side effects to an unborn child. Talk to your health care professional or pharmacist for more information. Do not breast-feed an infant while taking this medicine. What side effects may I notice from receiving this medicine? Side effects that you should report to your doctor or health care professional as soon as possible:  allergic reactions like skin rash, itching or hives, swelling of the face, lips, or tongue  signs of infection - fever or chills, cough, sore throat, pain or difficulty passing urine  signs of decreased platelets or bleeding - bruising, pinpoint red spots on the skin, black, tarry stools, nosebleeds  signs of decreased red blood cells - unusually weak or tired, fainting spells, lightheadedness  breathing problems  changes in hearing  changes in vision  chest pain  high blood pressure  low blood counts - This drug may decrease the number of white blood cells, red blood cells and platelets. You may be at increased risk for infections and bleeding.  nausea and vomiting  pain, swelling, redness or irritation at the injection site  pain, tingling, numbness in the hands or feet  problems with balance, talking,  walking  trouble passing urine or change in the amount of urine Side effects that usually do not require medical attention (report to your doctor or health care professional if they continue or are bothersome):  hair loss  loss of appetite  metallic taste in the mouth or changes in taste This list may not describe all possible side effects. Call your doctor for medical advice about side effects. You may report side effects to FDA at 1-800-FDA-1088. Where should I keep my medicine? This drug is given in a hospital or clinic and will not be stored at home. NOTE: This sheet is a summary. It may not cover all possible information. If you have questions about this medicine, talk to your doctor, pharmacist, or health care provider.  2020 Elsevier/Gold Standard (2007-09-10 14:38:05)  
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
## 2020-03-12 NOTE — Progress Notes (Signed)
  Oncology Nurse Navigator Documentation  Navigator Location: [facility]Med Onc (03/12/20 0900)   )Navigator Encounter Type: Appt/Treatment Plan Review (03/12/20 0900)                     Patient Visit Type: MedOnc (03/12/20 0900) Treatment Phase: Pre-Tx/Tx Discussion (03/12/20 0900)     Interventions: None Required (03/12/20 0900)           appt/treatment plan review. Will follow up with patient at next clinic visit.           Time Spent with Patient: 15 (03/12/20 0900)

## 2020-03-12 NOTE — Progress Notes (Signed)
START ON PATHWAY REGIMEN - Non-Small Cell Lung     A cycle is every 21 days:     Pembrolizumab      Pemetrexed      Carboplatin   **Always confirm dose/schedule in your pharmacy ordering system**  Patient Characteristics: Stage IV Metastatic, Nonsquamous, Initial Chemotherapy/Immunotherapy, PS = 0, 1, ALK Rearrangement Negative and ROS1 Rearrangement Negative and NTRK Gene Fusion?Negative and RET Gene Fusion?Negative and EGFR Mutation Negative, PD-L1 Expression Positive   ? 50% (TPS) and Immunotherapy Candidate Therapeutic Status: Stage IV Metastatic Histology: Nonsquamous Cell ROS1 Rearrangement Status: Negative Other Mutations/Biomarkers: Quantity Not Sufficient Chemotherapy/Immunotherapy LOT: Initial Chemotherapy/Immunotherapy Molecular Targeted Therapy: Not Appropriate KRAS G12C Mutation Status: Quantity Not Sufficient MET Exon 14 Mutation Status: Awaiting Test Results RET Gene Fusion Status: Awaiting Test Results EGFR Mutation Status: Negative/Wild Type NTRK Gene Fusion Status: Awaiting Test Results PD-L1 Expression Status: PD-L1 Positive ? 50% (TPS) ALK Rearrangement Status: Negative BRAF V600E Mutation Status: Awaiting Test Results ECOG Performance Status: 1 Biomarker Assessment Status Confirmation: All Genomic Markers Negative, or Only MET+ or BRAF+ or KRAS G12C+ Immunotherapy Candidate Status: Candidate for Immunotherapy Intent of Therapy: Non-Curative / Palliative Intent, Discussed with Patient

## 2020-03-15 ENCOUNTER — Inpatient Hospital Stay: Payer: Medicare HMO

## 2020-03-15 ENCOUNTER — Inpatient Hospital Stay: Payer: Medicare HMO | Admitting: Oncology

## 2020-03-15 ENCOUNTER — Telehealth (INDEPENDENT_AMBULATORY_CARE_PROVIDER_SITE_OTHER): Payer: Self-pay

## 2020-03-15 ENCOUNTER — Other Ambulatory Visit: Payer: Self-pay

## 2020-03-15 NOTE — Telephone Encounter (Signed)
I attempted to contact the patient and a message was left for a return call. On Friday 03/12/20 from Novella Olive RN: From: Luella Cook, RN  Sent: 03/12/2020  9:07 AM EDT  To: Devona Konig, CMA   Pt will start chemo next tues9/28. We will do his first treatment peripheral IV but would like to get his port placed the next week 10/4 to 10/8 schedule if possible.  Let me know  Thanks  Judeen Hammans    Me: From: Devona Konig, [place]  Sent: 03/12/2020 10:18 AM EDT  To: Luella Cook, RN   I can get him on for Dew on 03/22/20 will that work ?  Stevie Kern: Luella Cook, RN  Devona Konig, [place] Yes that will be fine. If you want me to tell him about the appts I can and when the covid test will need to be done. I have signed him up for a thoracentesis for hopefully next wed. And he will need a covid test for that . I assume that for each procedure he will need covid test or will 1 work for both procedures?  Thanks  Judeen Hammans  Me: Devona Konig, CMA  Luella Cook, RN Rockne Menghini,  I did attempt to reach the patient and a message was left for a return call or to call you. I will be mailing the information to the patient as well. 03/22/20 with a 6:45 am arrival to [facility], NPO after 12 midnight, can take all meds with small sips of water and have one person with him. Covid test on 03/18/20 between 8-1 pm at the [facility] drive up. And the one should be okay as long as he quarantines after the procedure I believe.  Thank you  Mickel Baas  The pre-procedure instructions will be mailed.

## 2020-03-16 ENCOUNTER — Encounter: Payer: Self-pay | Admitting: Oncology

## 2020-03-16 ENCOUNTER — Encounter: Payer: Self-pay | Admitting: *Deleted

## 2020-03-16 ENCOUNTER — Other Ambulatory Visit: Payer: Self-pay | Admitting: *Deleted

## 2020-03-16 ENCOUNTER — Inpatient Hospital Stay: Payer: Medicare HMO

## 2020-03-16 ENCOUNTER — Inpatient Hospital Stay (HOSPITAL_BASED_OUTPATIENT_CLINIC_OR_DEPARTMENT_OTHER): Payer: Medicare HMO | Admitting: Oncology

## 2020-03-16 VITALS — BP 105/82 | HR 92 | Temp 98.2°F | Resp 16 | Ht 69.0 in | Wt 168.7 lb

## 2020-03-16 DIAGNOSIS — J910 Malignant pleural effusion: Secondary | ICD-10-CM

## 2020-03-16 DIAGNOSIS — C3490 Malignant neoplasm of unspecified part of unspecified bronchus or lung: Secondary | ICD-10-CM

## 2020-03-16 DIAGNOSIS — C3491 Malignant neoplasm of unspecified part of right bronchus or lung: Secondary | ICD-10-CM

## 2020-03-16 DIAGNOSIS — Z7189 Other specified counseling: Secondary | ICD-10-CM

## 2020-03-16 DIAGNOSIS — R5383 Other fatigue: Secondary | ICD-10-CM | POA: Diagnosis not present

## 2020-03-16 DIAGNOSIS — G893 Neoplasm related pain (acute) (chronic): Secondary | ICD-10-CM

## 2020-03-16 DIAGNOSIS — Z5112 Encounter for antineoplastic immunotherapy: Secondary | ICD-10-CM | POA: Diagnosis not present

## 2020-03-16 DIAGNOSIS — D509 Iron deficiency anemia, unspecified: Secondary | ICD-10-CM | POA: Diagnosis not present

## 2020-03-16 DIAGNOSIS — Z5111 Encounter for antineoplastic chemotherapy: Secondary | ICD-10-CM

## 2020-03-16 DIAGNOSIS — C3431 Malignant neoplasm of lower lobe, right bronchus or lung: Secondary | ICD-10-CM | POA: Diagnosis not present

## 2020-03-16 DIAGNOSIS — R5381 Other malaise: Secondary | ICD-10-CM | POA: Diagnosis not present

## 2020-03-16 DIAGNOSIS — E785 Hyperlipidemia, unspecified: Secondary | ICD-10-CM | POA: Diagnosis not present

## 2020-03-16 DIAGNOSIS — I10 Essential (primary) hypertension: Secondary | ICD-10-CM | POA: Diagnosis not present

## 2020-03-16 LAB — COMPREHENSIVE METABOLIC PANEL
ALT: 17 U/L (ref 0–44)
AST: 18 U/L (ref 15–41)
Albumin: 3.1 g/dL — ABNORMAL LOW (ref 3.5–5.0)
Alkaline Phosphatase: 56 U/L (ref 38–126)
Anion gap: 9 (ref 5–15)
BUN: 7 mg/dL — ABNORMAL LOW (ref 8–23)
CO2: 24 mmol/L (ref 22–32)
Calcium: 8.6 mg/dL — ABNORMAL LOW (ref 8.9–10.3)
Chloride: 106 mmol/L (ref 98–111)
Creatinine, Ser: 0.91 mg/dL (ref 0.61–1.24)
GFR calc Af Amer: >60 mL/min (ref >60)
GFR calc non Af Amer: >60 mL/min (ref >60)
Glucose, Bld: 99 mg/dL (ref 70–99)
Potassium: 3.6 mmol/L (ref 3.5–5.1)
Sodium: 139 mmol/L (ref 135–145)
Total Bilirubin: 0.5 mg/dL (ref 0.3–1.2)
Total Protein: 7.4 g/dL (ref 6.5–8.1)

## 2020-03-16 LAB — CBC WITH DIFFERENTIAL/PLATELET
Abs Immature Granulocytes: 0.02 10*3/uL (ref 0.00–0.07)
Basophils Absolute: 0 10*3/uL (ref 0.0–0.1)
Basophils Relative: 1 %
Eosinophils Absolute: 0.3 10*3/uL (ref 0.0–0.5)
Eosinophils Relative: 3 %
HCT: 40 % (ref 39.0–52.0)
Hemoglobin: 12.2 g/dL — ABNORMAL LOW (ref 13.0–17.0)
Immature Granulocytes: 0 %
Lymphocytes Relative: 14 %
Lymphs Abs: 1.3 10*3/uL (ref 0.7–4.0)
MCH: 24.8 pg — ABNORMAL LOW (ref 26.0–34.0)
MCHC: 30.5 g/dL (ref 30.0–36.0)
MCV: 81.3 fL (ref 80.0–100.0)
Monocytes Absolute: 0.7 10*3/uL (ref 0.1–1.0)
Monocytes Relative: 8 %
Neutro Abs: 6.5 10*3/uL (ref 1.7–7.7)
Neutrophils Relative %: 74 %
Platelets: 337 10*3/uL (ref 150–400)
RBC: 4.92 MIL/uL (ref 4.22–5.81)
RDW: 14.6 % (ref 11.5–15.5)
WBC: 8.8 10*3/uL (ref 4.0–10.5)
nRBC: 0 % (ref 0.0–0.2)

## 2020-03-16 LAB — HEPATITIS B CORE ANTIBODY, TOTAL: Hep B Core Total Ab: NONREACTIVE

## 2020-03-16 LAB — HEPATITIS B SURFACE ANTIGEN: Hepatitis B Surface Ag: NONREACTIVE

## 2020-03-16 LAB — TSH: TSH: 2.569 u[IU]/mL (ref 0.350–4.500)

## 2020-03-16 MED ORDER — MORPHINE SULFATE 15 MG PO TABS: 15.0000 mg | ORAL_TABLET | Freq: Four times a day (QID) | ORAL | 0 refills | Status: DC | PRN

## 2020-03-16 MED ORDER — SODIUM CHLORIDE 0.9 % IV SOLN
200.0000 mg | Freq: Once | INTRAVENOUS | Status: AC
  Administered 2020-03-16: 200 mg via INTRAVENOUS
  Filled 2020-03-16: qty 8

## 2020-03-16 MED ORDER — SODIUM CHLORIDE 0.9 % IV SOLN
500.0000 mg/m2 | Freq: Once | INTRAVENOUS | Status: AC
  Administered 2020-03-16: 1000 mg via INTRAVENOUS
  Filled 2020-03-16: qty 40

## 2020-03-16 MED ORDER — MORPHINE SULFATE (PF) 2 MG/ML IV SOLN
4.0000 mg | Freq: Once | INTRAVENOUS | Status: AC
  Administered 2020-03-16: 4 mg via INTRAVENOUS
  Filled 2020-03-16: qty 2

## 2020-03-16 MED ORDER — SODIUM CHLORIDE 0.9 % IV SOLN
460.0000 mg | Freq: Once | INTRAVENOUS | Status: AC
  Administered 2020-03-16: 460 mg via INTRAVENOUS
  Filled 2020-03-16: qty 46

## 2020-03-16 MED ORDER — SODIUM CHLORIDE 0.9 % IV SOLN
Freq: Once | INTRAVENOUS | Status: AC
  Filled 2020-03-16: qty 250

## 2020-03-16 MED ORDER — SODIUM CHLORIDE 0.9 % IV SOLN
150.0000 mg | Freq: Once | INTRAVENOUS | Status: AC
  Administered 2020-03-16: 150 mg via INTRAVENOUS
  Filled 2020-03-16: qty 150

## 2020-03-16 MED ORDER — SODIUM CHLORIDE 0.9 % IV SOLN
10.0000 mg | Freq: Once | INTRAVENOUS | Status: AC
  Administered 2020-03-16: 10 mg via INTRAVENOUS
  Filled 2020-03-16: qty 10

## 2020-03-16 MED ORDER — CYANOCOBALAMIN 1000 MCG/ML IJ SOLN
1000.0000 ug | Freq: Once | INTRAMUSCULAR | Status: AC
  Administered 2020-03-16: 1000 ug via INTRAMUSCULAR
  Filled 2020-03-16: qty 1

## 2020-03-16 MED ORDER — PALONOSETRON HCL INJECTION 0.25 MG/5ML
0.2500 mg | Freq: Once | INTRAVENOUS | Status: AC
  Administered 2020-03-16: 0.25 mg via INTRAVENOUS
  Filled 2020-03-16: qty 5

## 2020-03-16 NOTE — Telephone Encounter (Signed)
I attempted to contact the patient today regarding his changed arrival time for his port placement with Dr. Lucky Cowboy on 03/22/20 from 6:45 am to 8:30 am. Patient's voicemail box is full so I was unable to leave a message. I sent Judeen Hammans at the cancer center a message and asked if she could let the patient know as he has appts with her office today.

## 2020-03-16 NOTE — Progress Notes (Signed)
Pt tolerated infusion well, no s/s of distress or reaction noted, pt stable at discharge.

## 2020-03-16 NOTE — Progress Notes (Signed)
  Oncology Nurse Navigator Documentation  Navigator Location: [facility] (03/16/20 1300)   )Navigator Encounter Type: Follow-up Appt;Treatment (03/16/20 1300)                   Treatment Initiated Date: 03/16/20 (03/16/20 1300) Patient Visit Type: MedOnc (03/16/20 1300) Treatment Phase: First Chemo Tx (03/16/20 1300) Barriers/Navigation Needs: Coordination of Care (03/16/20 1300)   Interventions: Coordination of Care (03/16/20 1300)   Coordination of Care: Appts (03/16/20 1300)         met with patient during follow up visit with Dr. Janese Banks to start chemotherapy treatment. Pt given resources regarding diagnosis and supportive services available. Reviewed upcoming appts. Contact info given and instructed to call with any further questions or needs. Pt verbalized understanding.         Time Spent with Patient: 45 (03/16/20 1300)

## 2020-03-17 ENCOUNTER — Telehealth: Payer: Self-pay

## 2020-03-17 ENCOUNTER — Other Ambulatory Visit
Admission: RE | Admit: 2020-03-17 | Discharge: 2020-03-17 | Disposition: A | Discharge status: Home / Self Care | Payer: Medicare HMO | Source: Ambulatory Visit | Attending: Vascular Surgery | Admitting: Vascular Surgery

## 2020-03-17 ENCOUNTER — Other Ambulatory Visit: Payer: Self-pay

## 2020-03-17 DIAGNOSIS — Z20822 Contact with and (suspected) exposure to covid-19: Secondary | ICD-10-CM | POA: Insufficient documentation

## 2020-03-17 DIAGNOSIS — Z01812 Encounter for preprocedural laboratory examination: Secondary | ICD-10-CM | POA: Insufficient documentation

## 2020-03-17 LAB — SARS CORONAVIRUS 2 (TAT 6-24 HRS): SARS Coronavirus 2: NEGATIVE

## 2020-03-17 LAB — HEPATITIS B SURFACE ANTIBODY, QUANTITATIVE: Hep B S AB Quant (Post): 27.4 m[IU]/mL (ref >9.9)

## 2020-03-17 NOTE — Telephone Encounter (Signed)
T/C to pt for follow up after receiving first infusion yesterday but no answer and no voice mail.

## 2020-03-18 ENCOUNTER — Ambulatory Visit
Admission: RE | Admit: 2020-03-18 | Discharge: 2020-03-18 | Disposition: A | Discharge status: Home / Self Care | Payer: Medicare HMO | Source: Ambulatory Visit | Attending: Oncology | Admitting: Oncology

## 2020-03-18 ENCOUNTER — Telehealth: Payer: Self-pay | Admitting: *Deleted

## 2020-03-18 ENCOUNTER — Ambulatory Visit
Admission: RE | Admit: 2020-03-18 | Discharge: 2020-03-18 | Disposition: A | Discharge status: Home / Self Care | Payer: Medicare HMO | Source: Ambulatory Visit | Attending: Physician Assistant | Admitting: Physician Assistant

## 2020-03-18 ENCOUNTER — Other Ambulatory Visit: Payer: Self-pay | Admitting: Physician Assistant

## 2020-03-18 DIAGNOSIS — J910 Malignant pleural effusion: Secondary | ICD-10-CM | POA: Insufficient documentation

## 2020-03-18 DIAGNOSIS — J939 Pneumothorax, unspecified: Secondary | ICD-10-CM | POA: Diagnosis not present

## 2020-03-18 DIAGNOSIS — J90 Pleural effusion, not elsewhere classified: Secondary | ICD-10-CM | POA: Diagnosis not present

## 2020-03-18 DIAGNOSIS — C3491 Malignant neoplasm of unspecified part of right bronchus or lung: Secondary | ICD-10-CM | POA: Diagnosis not present

## 2020-03-18 DIAGNOSIS — C3431 Malignant neoplasm of lower lobe, right bronchus or lung: Secondary | ICD-10-CM | POA: Diagnosis not present

## 2020-03-18 DIAGNOSIS — C3490 Malignant neoplasm of unspecified part of unspecified bronchus or lung: Secondary | ICD-10-CM | POA: Diagnosis not present

## 2020-03-18 NOTE — Telephone Encounter (Signed)
Message received that pt has questions regarding medication list. Called pt back this morning but he did not answer. Unable to leave message due to mailbox being full. Will try again later.

## 2020-03-19 ENCOUNTER — Telehealth: Payer: Self-pay | Admitting: *Deleted

## 2020-03-19 ENCOUNTER — Other Ambulatory Visit (INDEPENDENT_AMBULATORY_CARE_PROVIDER_SITE_OTHER): Payer: Self-pay | Admitting: Nurse Practitioner
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
## 2020-03-19 NOTE — Telephone Encounter (Signed)
Attempted to contact pt to follow up on pain after starting new pain medication a few days ago. Pt did not answer and unable to leave message due to mailbox being full. Tried to call pt's emergency contact, [name], and she did not answer either. Unable to leave message with her since her voicemail was not set up.

## 2020-03-22 ENCOUNTER — Encounter
Admission: RE | Disposition: A | Discharge status: Home / Self Care | Payer: Self-pay | Source: Home / Self Care | Attending: Vascular Surgery

## 2020-03-22 ENCOUNTER — Ambulatory Visit
Admission: RE | Admit: 2020-03-22 | Discharge: 2020-03-22 | Disposition: A | Discharge status: Home / Self Care | Payer: Medicare HMO | Attending: Vascular Surgery | Admitting: Vascular Surgery

## 2020-03-22 ENCOUNTER — Encounter: Payer: Self-pay | Admitting: Vascular Surgery

## 2020-03-22 ENCOUNTER — Other Ambulatory Visit: Payer: Self-pay

## 2020-03-22 DIAGNOSIS — C3490 Malignant neoplasm of unspecified part of unspecified bronchus or lung: Secondary | ICD-10-CM | POA: Diagnosis not present

## 2020-03-22 DIAGNOSIS — Z87891 Personal history of nicotine dependence: Secondary | ICD-10-CM | POA: Diagnosis not present

## 2020-03-22 DIAGNOSIS — G893 Neoplasm related pain (acute) (chronic): Secondary | ICD-10-CM | POA: Diagnosis not present

## 2020-03-22 DIAGNOSIS — I10 Essential (primary) hypertension: Secondary | ICD-10-CM | POA: Insufficient documentation

## 2020-03-22 DIAGNOSIS — C782 Secondary malignant neoplasm of pleura: Secondary | ICD-10-CM | POA: Diagnosis not present

## 2020-03-22 DIAGNOSIS — E785 Hyperlipidemia, unspecified: Secondary | ICD-10-CM | POA: Diagnosis not present

## 2020-03-22 DIAGNOSIS — Z885 Allergy status to narcotic agent status: Secondary | ICD-10-CM | POA: Diagnosis not present

## 2020-03-22 DIAGNOSIS — J910 Malignant pleural effusion: Secondary | ICD-10-CM | POA: Insufficient documentation

## 2020-03-22 DIAGNOSIS — Z79899 Other long term (current) drug therapy: Secondary | ICD-10-CM | POA: Diagnosis not present

## 2020-03-22 DIAGNOSIS — D509 Iron deficiency anemia, unspecified: Secondary | ICD-10-CM | POA: Insufficient documentation

## 2020-03-22 DIAGNOSIS — Z7902 Long term (current) use of antithrombotics/antiplatelets: Secondary | ICD-10-CM | POA: Diagnosis not present

## 2020-03-22 DIAGNOSIS — C3491 Malignant neoplasm of unspecified part of right bronchus or lung: Secondary | ICD-10-CM | POA: Diagnosis not present

## 2020-03-22 HISTORY — PX: PORTA CATH INSERTION: CATH118285

## 2020-03-22 SURGERY — PORTA CATH INSERTION
Anesthesia: Moderate Sedation

## 2020-03-22 MED ORDER — FENTANYL CITRATE (PF) 100 MCG/2ML IJ SOLN
INTRAMUSCULAR | Status: DC | PRN
Start: 2020-03-22 — End: 2020-03-22
  Administered 2020-03-22: 25 ug via INTRAVENOUS
  Administered 2020-03-22: 50 ug via INTRAVENOUS

## 2020-03-22 MED ORDER — ONDANSETRON HCL 4 MG/2ML IJ SOLN: 4.0000 mg | Freq: Four times a day (QID) | INTRAMUSCULAR | Status: DC | PRN

## 2020-03-22 MED ORDER — SODIUM CHLORIDE 0.9 % IV SOLN
INTRAVENOUS | Status: DC
  Administered 2020-03-22: 1000 mL via INTRAVENOUS

## 2020-03-22 MED ORDER — METHYLPREDNISOLONE SODIUM SUCC 125 MG IJ SOLR: 125.0000 mg | Freq: Once | INTRAMUSCULAR | Status: DC | PRN

## 2020-03-22 MED ORDER — CEFAZOLIN SODIUM-DEXTROSE 2-4 GM/100ML-% IV SOLN: 2.0000 g | Freq: Once | INTRAVENOUS | Status: AC

## 2020-03-22 MED ORDER — SODIUM CHLORIDE 0.9 % IV SOLN
Freq: Once | INTRAVENOUS | Status: DC
  Filled 2020-03-22: qty 2

## 2020-03-22 MED ORDER — FENTANYL CITRATE (PF) 100 MCG/2ML IJ SOLN
INTRAMUSCULAR | Status: AC
  Filled 2020-03-22: qty 2

## 2020-03-22 MED ORDER — CEFAZOLIN SODIUM-DEXTROSE 2-4 GM/100ML-% IV SOLN
INTRAVENOUS | Status: AC
  Administered 2020-03-22: 2 g via INTRAVENOUS
  Filled 2020-03-22: qty 100

## 2020-03-22 MED ORDER — FENTANYL CITRATE (PF) 100 MCG/2ML IJ SOLN: 12.5000 ug | Freq: Once | INTRAMUSCULAR | Status: DC | PRN

## 2020-03-22 MED ORDER — CHLORHEXIDINE GLUCONATE CLOTH 2 % EX PADS
6.0000 | MEDICATED_PAD | Freq: Every day | CUTANEOUS | Status: DC
  Administered 2020-03-22: 6 via TOPICAL

## 2020-03-22 MED ORDER — MIDAZOLAM HCL 2 MG/ML PO SYRP: 8.0000 mg | ORAL_SOLUTION | Freq: Once | ORAL | Status: DC | PRN

## 2020-03-22 MED ORDER — MIDAZOLAM HCL 2 MG/2ML IJ SOLN
INTRAMUSCULAR | Status: DC | PRN
  Administered 2020-03-22: 1 mg via INTRAVENOUS
  Administered 2020-03-22: 2 mg via INTRAVENOUS

## 2020-03-22 MED ORDER — FAMOTIDINE 20 MG PO TABS: 40.0000 mg | ORAL_TABLET | Freq: Once | ORAL | Status: DC | PRN

## 2020-03-22 MED ORDER — DIPHENHYDRAMINE HCL 50 MG/ML IJ SOLN: 50.0000 mg | Freq: Once | INTRAMUSCULAR | Status: DC | PRN

## 2020-03-22 MED ORDER — MIDAZOLAM HCL 5 MG/5ML IJ SOLN
INTRAMUSCULAR | Status: AC
  Filled 2020-03-22: qty 5

## 2020-03-22 SURGICAL SUPPLY — 10 items
ADH SKN CLS APL DERMABOND .7 (GAUZE/BANDAGES/DRESSINGS) ×1
CANNULA 5F STIFF (CANNULA) ×1 IMPLANT
DERMABOND ADVANCED (GAUZE/BANDAGES/DRESSINGS) ×1
DERMABOND ADVANCED .7 DNX12 (GAUZE/BANDAGES/DRESSINGS) IMPLANT
KIT PORT POWER 8FR ISP CVUE (Port) ×1 IMPLANT
PACK ANGIOGRAPHY (CUSTOM PROCEDURE TRAY) ×2 IMPLANT
SUT MNCRL AB 4-0 PS2 18 (SUTURE) ×1 IMPLANT
SUT PROLENE 0 SH 30 (SUTURE) ×1 IMPLANT
SUT VIC AB 3-0 SH 27 (SUTURE) ×2
SUT VIC AB 3-0 SH 27X BRD (SUTURE) IMPLANT

## 2020-03-22 NOTE — Interval H&P Note (Signed)
History and Physical Interval Note:  03/22/2020 8:48 AM  Ryan Harvey  has presented today for surgery, with the diagnosis of Port Placement   Adenocarcinoma of lung   Pt to have Covid test on 03-18-20.  The various methods of treatment have been discussed with the patient and family. After consideration of risks, benefits and other options for treatment, the patient has consented to  Procedure(s): PORTA CATH INSERTION (N/A) as a surgical intervention.  The patient's history has been reviewed, patient examined, no change in status, stable for surgery.  I have reviewed the patient's chart and labs.  Questions were answered to the patient's satisfaction.     Leotis Pain

## 2020-03-22 NOTE — Op Note (Signed)
      [facility]       Operative Note  Date: 03/22/2020  Preoperative diagnosis:  1. Lung cancer  Postoperative diagnosis:  Same as above  Procedures: #1. Ultrasound guidance for vascular access to the right internal jugular vein. #2. Fluoroscopic guidance for placement of catheter. #3. Placement of CT compatible Port-A-Cath, right internal jugular vein.  Surgeon: Leotis Pain, MD.   Anesthesia: Local with moderate conscious sedation for approximately 25  minutes using 3 mg of Versed and 75 mcg of Fentanyl  Fluoroscopy time: less than 1 minute  Contrast used: 0  Estimated blood loss: 3 cc  Indication for the procedure:  The patient is a 74 y.o.male with lung cancer.  The patient needs a Port-A-Cath for durable venous access, chemotherapy, lab draws, and CT scans. We are asked to place this. Risks and benefits were discussed and informed consent was obtained.  Description of procedure: The patient was brought to the vascular and interventional radiology suite.  Moderate conscious sedation was administered throughout the procedure during a face to face encounter with the patient with my supervision of the RN administering medicines and monitoring the patient's vital signs, pulse oximetry, telemetry and mental status throughout from the start of the procedure until the patient was taken to the recovery room. The right neck chest and shoulder were sterilely prepped and draped, and a sterile surgical field was created. Ultrasound was used to help visualize a patent right internal jugular vein. This was then accessed under direct ultrasound guidance without difficulty with the Seldinger needle and a permanent image was recorded. A J-wire was placed. After skin nick and dilatation, the peel-away sheath was then placed over the wire. I then anesthetized an area under the clavicle approximately 1-2 fingerbreadths. A transverse incision was created and an inferior pocket was  created with electrocautery and blunt dissection. The port was then brought onto the field, placed into the pocket and secured to the chest wall with 2 Prolene sutures. The catheter was connected to the port and tunneled from the subclavicular incision to the access site. Fluoroscopic guidance was then used to cut the catheter to an appropriate length. The catheter was then placed through the peel-away sheath and the peel-away sheath was removed. The catheter tip was parked in excellent location under fluorocoscopic guidance in the cavoatrial junction. The pocket was then irrigated with antibiotic impregnated saline and the wound was closed with a running 3-0 Vicryl and a 4-0 Monocryl. The access incision was closed with a single 4-0 Monocryl. The Huber needle was used to withdraw blood and flush the port with heparinized saline. Dermabond was then placed as a dressing. The patient tolerated the procedure well and was taken to the recovery room in stable condition.   Leotis Pain 03/22/2020 10:24 AM   This note was created with Dragon Medical transcription system. Any errors in dictation are purely unintentional.

## 2020-03-23 ENCOUNTER — Other Ambulatory Visit: Payer: Self-pay | Admitting: Anatomic Pathology & Clinical Pathology

## 2020-03-26 ENCOUNTER — Inpatient Hospital Stay: Payer: Medicare HMO | Attending: Oncology

## 2020-03-26 ENCOUNTER — Encounter: Payer: Self-pay | Admitting: Oncology

## 2020-03-26 ENCOUNTER — Inpatient Hospital Stay (HOSPITAL_BASED_OUTPATIENT_CLINIC_OR_DEPARTMENT_OTHER): Payer: Medicare HMO | Admitting: Oncology

## 2020-03-26 ENCOUNTER — Other Ambulatory Visit: Payer: Self-pay

## 2020-03-26 ENCOUNTER — Encounter: Payer: Self-pay | Admitting: *Deleted

## 2020-03-26 ENCOUNTER — Inpatient Hospital Stay: Payer: Medicare HMO

## 2020-03-26 VITALS — BP 112/67 | HR 113 | Temp 98.0°F | Resp 20 | Wt 162.6 lb

## 2020-03-26 DIAGNOSIS — Z170 Estrogen receptor positive status [ER+]: Secondary | ICD-10-CM | POA: Insufficient documentation

## 2020-03-26 DIAGNOSIS — C782 Secondary malignant neoplasm of pleura: Secondary | ICD-10-CM | POA: Diagnosis not present

## 2020-03-26 DIAGNOSIS — Z87891 Personal history of nicotine dependence: Secondary | ICD-10-CM | POA: Diagnosis not present

## 2020-03-26 DIAGNOSIS — G893 Neoplasm related pain (acute) (chronic): Secondary | ICD-10-CM | POA: Diagnosis not present

## 2020-03-26 DIAGNOSIS — Z7952 Long term (current) use of systemic steroids: Secondary | ICD-10-CM | POA: Diagnosis not present

## 2020-03-26 DIAGNOSIS — I10 Essential (primary) hypertension: Secondary | ICD-10-CM | POA: Diagnosis not present

## 2020-03-26 DIAGNOSIS — Z5111 Encounter for antineoplastic chemotherapy: Secondary | ICD-10-CM | POA: Insufficient documentation

## 2020-03-26 DIAGNOSIS — Z5112 Encounter for antineoplastic immunotherapy: Secondary | ICD-10-CM | POA: Insufficient documentation

## 2020-03-26 DIAGNOSIS — T451X5A Adverse effect of antineoplastic and immunosuppressive drugs, initial encounter: Secondary | ICD-10-CM | POA: Insufficient documentation

## 2020-03-26 DIAGNOSIS — C3491 Malignant neoplasm of unspecified part of right bronchus or lung: Secondary | ICD-10-CM

## 2020-03-26 DIAGNOSIS — D6481 Anemia due to antineoplastic chemotherapy: Secondary | ICD-10-CM | POA: Insufficient documentation

## 2020-03-26 DIAGNOSIS — Z79899 Other long term (current) drug therapy: Secondary | ICD-10-CM | POA: Diagnosis not present

## 2020-03-26 DIAGNOSIS — R5383 Other fatigue: Secondary | ICD-10-CM | POA: Insufficient documentation

## 2020-03-26 DIAGNOSIS — C3431 Malignant neoplasm of lower lobe, right bronchus or lung: Secondary | ICD-10-CM | POA: Diagnosis not present

## 2020-03-26 DIAGNOSIS — J910 Malignant pleural effusion: Secondary | ICD-10-CM | POA: Diagnosis not present

## 2020-03-26 DIAGNOSIS — R5381 Other malaise: Secondary | ICD-10-CM | POA: Diagnosis not present

## 2020-03-26 DIAGNOSIS — E785 Hyperlipidemia, unspecified: Secondary | ICD-10-CM | POA: Insufficient documentation

## 2020-03-26 LAB — COMPREHENSIVE METABOLIC PANEL
ALT: 23 U/L (ref 0–44)
AST: 23 U/L (ref 15–41)
Albumin: 2.9 g/dL — ABNORMAL LOW (ref 3.5–5.0)
Alkaline Phosphatase: 50 U/L (ref 38–126)
Anion gap: 7 (ref 5–15)
BUN: 9 mg/dL (ref 8–23)
CO2: 25 mmol/L (ref 22–32)
Calcium: 7.8 mg/dL — ABNORMAL LOW (ref 8.9–10.3)
Chloride: 103 mmol/L (ref 98–111)
Creatinine, Ser: 0.83 mg/dL (ref 0.61–1.24)
GFR calc non Af Amer: >60 mL/min (ref >60)
Glucose, Bld: 164 mg/dL — ABNORMAL HIGH (ref 70–99)
Potassium: 3.7 mmol/L (ref 3.5–5.1)
Sodium: 135 mmol/L (ref 135–145)
Total Bilirubin: 0.4 mg/dL (ref 0.3–1.2)
Total Protein: 7.1 g/dL (ref 6.5–8.1)

## 2020-03-26 LAB — CBC WITH DIFFERENTIAL/PLATELET
Abs Immature Granulocytes: 0.02 10*3/uL (ref 0.00–0.07)
Basophils Absolute: 0 10*3/uL (ref 0.0–0.1)
Basophils Relative: 1 %
Eosinophils Absolute: 0 10*3/uL (ref 0.0–0.5)
Eosinophils Relative: 1 %
HCT: 34.6 % — ABNORMAL LOW (ref 39.0–52.0)
Hemoglobin: 10.8 g/dL — ABNORMAL LOW (ref 13.0–17.0)
Immature Granulocytes: 1 %
Lymphocytes Relative: 31 %
Lymphs Abs: 0.9 10*3/uL (ref 0.7–4.0)
MCH: 24.8 pg — ABNORMAL LOW (ref 26.0–34.0)
MCHC: 31.2 g/dL (ref 30.0–36.0)
MCV: 79.5 fL — ABNORMAL LOW (ref 80.0–100.0)
Monocytes Absolute: 0.3 10*3/uL (ref 0.1–1.0)
Monocytes Relative: 12 %
Neutro Abs: 1.5 10*3/uL — ABNORMAL LOW (ref 1.7–7.7)
Neutrophils Relative %: 54 %
Platelets: 197 10*3/uL (ref 150–400)
RBC: 4.35 MIL/uL (ref 4.22–5.81)
RDW: 14 % (ref 11.5–15.5)
Smear Review: NORMAL
WBC: 2.8 10*3/uL — ABNORMAL LOW (ref 4.0–10.5)
nRBC: 0 % (ref 0.0–0.2)

## 2020-03-26 MED ORDER — SODIUM CHLORIDE 0.9 % IV SOLN
Freq: Once | INTRAVENOUS | Status: AC
  Administered 2020-03-26: 1000 mL via INTRAVENOUS
  Filled 2020-03-26: qty 250

## 2020-03-26 MED ORDER — HEPARIN SOD (PORK) LOCK FLUSH 100 UNIT/ML IV SOLN
500.0000 [IU] | Freq: Once | INTRAVENOUS | Status: AC
  Administered 2020-03-26: 500 [IU] via INTRAVENOUS
  Filled 2020-03-26: qty 5

## 2020-03-26 MED ORDER — SODIUM CHLORIDE 0.9% FLUSH
10.0000 mL | INTRAVENOUS | Status: DC | PRN
  Administered 2020-03-26: 10 mL via INTRAVENOUS
  Filled 2020-03-26: qty 10

## 2020-03-26 MED ORDER — HEPARIN SOD (PORK) LOCK FLUSH 100 UNIT/ML IV SOLN
INTRAVENOUS | Status: AC
  Filled 2020-03-26: qty 5

## 2020-03-26 NOTE — Progress Notes (Signed)
  Oncology Nurse Navigator Documentation  Navigator Location: [facility]c (03/26/20 1200)   )Navigator Encounter Type: Follow-up Appt (03/26/20 1200)                     Patient Visit Type: MedOnc (03/26/20 1200)   Barriers/Navigation Needs: Financial Toxicity (03/26/20 1200)   Interventions: Referrals (03/26/20 1200) Referrals: Social Work (03/26/20 1200)         met with patient during follow up visit with Dr. Janese Banks. Pt stated that he is doing well and asks when he can go back to work. Pt advised to discuss with Dr. Janese Banks regarding returning to work. Pt informed that if needs help with finances then can make referral to social work. Pt stated that needs help with utilities at this time. Referral placed to social work. Nothing further needed at this time. Instructed pt to call with any further questions or needs. Pt verbalized understanding.            Time Spent with Patient: 30 (03/26/20 1200)
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
## 2020-04-01 ENCOUNTER — Ambulatory Visit
Admission: RE | Admit: 2020-04-01 | Discharge: 2020-04-01 | Disposition: A | Discharge status: Home / Self Care | Payer: Medicare HMO | Source: Ambulatory Visit | Attending: Oncology | Admitting: Oncology

## 2020-04-01 ENCOUNTER — Telehealth: Payer: Self-pay | Admitting: *Deleted

## 2020-04-01 ENCOUNTER — Encounter: Payer: Self-pay | Admitting: Oncology

## 2020-04-01 ENCOUNTER — Other Ambulatory Visit: Payer: Medicare HMO

## 2020-04-01 DIAGNOSIS — J90 Pleural effusion, not elsewhere classified: Secondary | ICD-10-CM | POA: Insufficient documentation

## 2020-04-01 DIAGNOSIS — J9811 Atelectasis: Secondary | ICD-10-CM | POA: Diagnosis not present

## 2020-04-01 NOTE — Progress Notes (Signed)
Tumor Board Documentation  Ryan Harvey was presented by Dr Janese Banks at our Tumor Board on 04/01/2020, which included representatives from medical oncology, radiation oncology, surgical oncology, surgical, pharmacy, internal medicine, navigation, pathology, radiology, genetics, research, palliative care, pulmonology.  Ryan Harvey currently presents as a new patient, for Ryan Harvey, for new positive pathology with history of the following treatments: active survellience.  Additionally, we reviewed previous medical and familial history, history of present illness, and recent lab results along with all available histopathologic and imaging studies. The tumor board considered available treatment options and made the following recommendations: Additional screening, Immunotherapy, Chemotherapy (Send path for BRAF testing) Pleurex cath placement if needed  The following procedures/referrals were also placed: No orders of the defined types were placed in this encounter.   Clinical Trial Status: not discussed   Staging used: AJCC Stage Group  AJCC Staging: T: 3 N: 2 M: 1a Group: Stage IV aA Adenocarcinoma of lung   National site-specific guidelines NCCN were discussed with respect to the case.  Tumor board is a meeting of clinicians from various specialty areas who evaluate and discuss patients for whom a multidisciplinary approach is being considered. Final determinations in the plan of care are those of the provider(s). The responsibility for follow up of recommendations given during tumor board is that of the provider.   Today's extended care, comprehensive team conference, Ryan Harvey was not present for the discussion and was not examined.   Multidisciplinary Tumor Board is a multidisciplinary case peer review process.  Decisions discussed in the Multidisciplinary Tumor Board reflect the opinions of the specialists present at the conference without having examined the patient.  Ultimately, treatment and  diagnostic decisions rest with the primary provider(s) and the patient.

## 2020-04-01 NOTE — Telephone Encounter (Signed)
Pt having increased cough and shortness of breath. Per Dr. Janese Banks, pt needs chest xray today and see her tomorrow morning. Based on chest xray, pt may need to see Dr. Genevive Bi to consider pleurx catheter placement.

## 2020-04-02 ENCOUNTER — Other Ambulatory Visit: Payer: Self-pay

## 2020-04-02 ENCOUNTER — Telehealth: Payer: Self-pay | Admitting: Cardiothoracic Surgery

## 2020-04-02 ENCOUNTER — Ambulatory Visit (INDEPENDENT_AMBULATORY_CARE_PROVIDER_SITE_OTHER): Payer: Medicare HMO | Admitting: Cardiothoracic Surgery

## 2020-04-02 ENCOUNTER — Encounter: Payer: Self-pay | Admitting: Cardiothoracic Surgery

## 2020-04-02 ENCOUNTER — Telehealth: Payer: Self-pay

## 2020-04-02 ENCOUNTER — Encounter: Payer: Self-pay | Admitting: *Deleted

## 2020-04-02 ENCOUNTER — Inpatient Hospital Stay: Payer: Medicare HMO | Admitting: Oncology

## 2020-04-02 VITALS — BP 126/75 | HR 91 | Temp 97.5°F | Resp 12 | Ht 69.0 in | Wt 163.0 lb

## 2020-04-02 DIAGNOSIS — J910 Malignant pleural effusion: Secondary | ICD-10-CM | POA: Diagnosis not present

## 2020-04-02 DIAGNOSIS — C3491 Malignant neoplasm of unspecified part of right bronchus or lung: Secondary | ICD-10-CM
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
## 2020-04-02 NOTE — Telephone Encounter (Signed)
Anticoagulant clearance obtained- patient optimized for surgery =low risk -stop plavix  7 days prior to surgery.   Plavix notified of the above and verbally understands.

## 2020-04-02 NOTE — Telephone Encounter (Signed)
Patient and wife have been advised of Pre-Admission date/time, COVID Testing date and Surgery date.  Surgery Date: 04/06/20 Preadmission Testing Date: 04/05/20 (phone 8a-1p) Covid Testing Date: 04/05/20 - patient advised to go to the [facility] between 8a-1p   Patient has been made aware to call [phone], between 1-3:00pm the day before surgery, to find out what time to arrive for surgery.

## 2020-04-02 NOTE — Patient Instructions (Addendum)
Please read through the handbook on Pleurx Catheter.  Anticoagulant clearance for Plavix faxed to [facility].  Our surgery scheduler will call to schedule your surgery within 24-48 hours. Please have the [place] surgery sheet available when speaking with her.

## 2020-04-02 NOTE — Telephone Encounter (Signed)
Anticoagulant clearance faxed to [facility] at this time-as Urgent.
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
## 2020-04-05 ENCOUNTER — Other Ambulatory Visit
Admission: RE | Admit: 2020-04-05 | Discharge: 2020-04-05 | Disposition: A | Discharge status: Home / Self Care | Payer: Medicare HMO | Source: Ambulatory Visit | Attending: Cardiothoracic Surgery | Admitting: Cardiothoracic Surgery

## 2020-04-05 ENCOUNTER — Other Ambulatory Visit: Payer: Self-pay

## 2020-04-05 ENCOUNTER — Encounter
Admission: RE | Admit: 2020-04-05 | Discharge: 2020-04-05 | Disposition: A | Discharge status: Home / Self Care | Payer: Medicare HMO | Source: Ambulatory Visit | Attending: Cardiothoracic Surgery | Admitting: Cardiothoracic Surgery

## 2020-04-05 ENCOUNTER — Other Ambulatory Visit: Payer: Self-pay | Admitting: Cardiothoracic Surgery

## 2020-04-05 DIAGNOSIS — Z01812 Encounter for preprocedural laboratory examination: Secondary | ICD-10-CM | POA: Insufficient documentation

## 2020-04-05 DIAGNOSIS — Z20822 Contact with and (suspected) exposure to covid-19: Secondary | ICD-10-CM | POA: Insufficient documentation

## 2020-04-05 DIAGNOSIS — J910 Malignant pleural effusion: Secondary | ICD-10-CM

## 2020-04-05 HISTORY — DX: Anemia, unspecified: D64.9

## 2020-04-05 HISTORY — DX: Dyspnea, unspecified: R06.00

## 2020-04-05 LAB — SARS CORONAVIRUS 2 (TAT 6-24 HRS): SARS Coronavirus 2: NEGATIVE

## 2020-04-05 NOTE — Patient Instructions (Signed)
Your procedure is scheduled on:04-06-20 TUESDAY Report to Day Surgery on the 2nd floor of the [facility]. To find out your arrival time, please call [phone] between 1PM - 3PM on:04-05-20 MONDAY  REMEMBER: Instructions that are not followed completely may result in serious medical risk, up to and including death; or upon the discretion of your surgeon and anesthesiologist your surgery may need to be rescheduled.  Do not eat food after midnight the night before surgery.  No gum chewing, lozengers or hard candies.  You may however, drink CLEAR liquids up to 2 hours before you are scheduled to arrive for your surgery. Do not drink anything within 2 hours of your scheduled arrival time.  Clear liquids include: - water  - apple juice without pulp - gatorade (not RED, PURPLE, OR BLUE) - black coffee or tea (Do NOT add milk or creamers to the coffee or tea) Do NOT drink anything that is not on this list.  TAKE THESE MEDICATIONS THE MORNING OF SURGERY WITH A SIP OF WATER: -NORVASC (AMLODIPINE) -YOU MAY TAKE MORPHINE FOR PAIN IF NEEDED  Follow recommendations from Cardiologist, Pulmonologist or PCP regarding stopping Aspirin, Coumadin, Plavix, Eliquis, Pradaxa, or Pletal-PLAVIX WAS STOPPED ON 04-02-20 FRIDAY  One week prior to surgery: Stop Anti-inflammatories (NSAIDS) such as Advil, Aleve, Ibuprofen, Motrin, Naproxen, Naprosyn and Aspirin based products such as Excedrin, Goodys Powder, BC Powder-TYLENOL/MORPHINE OK TO TAKE IF NEEDED   Stop ANY OVER THE COUNTER supplements until after surgery.  No Alcohol for 24 hours before or after surgery.  No Smoking including e-cigarettes for 24 hours prior to surgery.  No chewable tobacco products for at least 6 hours prior to surgery.  No nicotine patches on the day of surgery.  Do not use any "recreational" drugs for at least a week prior to your surgery.  Please be advised that the combination of cocaine and anesthesia may have negative  outcomes, up to and including death. If you test positive for cocaine, your surgery will be cancelled.  On the morning of surgery brush your teeth with toothpaste and water, you may rinse your mouth with mouthwash if you wish. Do not swallow any toothpaste or mouthwash.  Do not wear jewelry, make-up, hairpins, clips or nail polish.  Do not wear lotions, powders, or perfumes.   Do not shave 48 hours prior to surgery.   Contact lenses, hearing aids and dentures may not be worn into surgery.  Do not bring valuables to the hospital. [facility] is not responsible for any missing/lost belongings or valuables.   Notify your doctor if there is any change in your medical condition (cold, fever, infection).  Wear comfortable clothing (specific to your surgery type) to the hospital.  Plan for stool softeners for home use; pain medications have a tendency to cause constipation. You can also help prevent constipation by eating foods high in fiber such as fruits and vegetables and drinking plenty of fluids as your diet allows.  After surgery, you can help prevent lung complications by doing breathing exercises.  Take deep breaths and cough every 1-2 hours. Your doctor may order a device called an Incentive Spirometer to help you take deep breaths. When coughing or sneezing, hold a pillow firmly against your incision with both hands. This is called "splinting." Doing this helps protect your incision. It also decreases belly discomfort.  If you are being admitted to the hospital overnight, leave your suitcase in the car. After surgery it may be brought to your room.  If you are being discharged the day of surgery, you will not be allowed to drive home. You will need a responsible adult (18 years or older) to drive you home and stay with you that night.   If you are taking public transportation, you will need to have a responsible adult (18 years or older) with you. Please confirm with your  physician that it is acceptable to use public transportation.   Please call the [facility] at [phone] if you have any questions about these instructions.  Visitation Policy:  Patients undergoing a surgery or procedure may have one family member or support person with them as long as that person is not COVID-19 positive or experiencing its symptoms.  That person may remain in the waiting area during the procedure.  Inpatient Visitation Update:   In an effort to ensure the safety of our team members and our patients, we are implementing a change to our visitation policy:  Effective Monday, Aug. 9, at 7 a.m., inpatients will be allowed one support person.  o The support person may change daily.  o The support person must pass our screening, gel in and out, and wear a mask at all times, including in the patient's room.  o Patients must also wear a mask when staff or their support person are in the room.  o Masking is required regardless of vaccination status.  Systemwide, no visitors 17 or younger.

## 2020-04-06 ENCOUNTER — Encounter: Payer: Self-pay | Admitting: Cardiothoracic Surgery

## 2020-04-06 ENCOUNTER — Other Ambulatory Visit: Payer: Self-pay

## 2020-04-06 ENCOUNTER — Inpatient Hospital Stay: Payer: Medicare HMO

## 2020-04-06 ENCOUNTER — Ambulatory Visit: Payer: Medicare HMO

## 2020-04-06 ENCOUNTER — Inpatient Hospital Stay: Payer: Medicare HMO | Admitting: Oncology

## 2020-04-06 ENCOUNTER — Observation Stay
Admission: RE | Admit: 2020-04-06 | Discharge: 2020-04-07 | Disposition: A | Discharge status: Home / Self Care | Payer: Medicare HMO | Attending: Cardiothoracic Surgery | Admitting: Cardiothoracic Surgery

## 2020-04-06 ENCOUNTER — Other Ambulatory Visit: Payer: Self-pay | Admitting: Oncology

## 2020-04-06 ENCOUNTER — Encounter
Admission: RE | Disposition: A | Discharge status: Home / Self Care | Payer: Self-pay | Source: Home / Self Care | Attending: Cardiothoracic Surgery

## 2020-04-06 DIAGNOSIS — Z87891 Personal history of nicotine dependence: Secondary | ICD-10-CM | POA: Insufficient documentation

## 2020-04-06 DIAGNOSIS — J90 Pleural effusion, not elsewhere classified: Secondary | ICD-10-CM | POA: Diagnosis not present

## 2020-04-06 DIAGNOSIS — C3431 Malignant neoplasm of lower lobe, right bronchus or lung: Principal | ICD-10-CM | POA: Diagnosis present

## 2020-04-06 DIAGNOSIS — J910 Malignant pleural effusion: Secondary | ICD-10-CM | POA: Insufficient documentation

## 2020-04-06 DIAGNOSIS — E782 Mixed hyperlipidemia: Secondary | ICD-10-CM | POA: Diagnosis not present

## 2020-04-06 DIAGNOSIS — Z885 Allergy status to narcotic agent status: Secondary | ICD-10-CM | POA: Insufficient documentation

## 2020-04-06 DIAGNOSIS — C782 Secondary malignant neoplasm of pleura: Secondary | ICD-10-CM | POA: Diagnosis not present

## 2020-04-06 DIAGNOSIS — J9811 Atelectasis: Secondary | ICD-10-CM | POA: Diagnosis not present

## 2020-04-06 DIAGNOSIS — J939 Pneumothorax, unspecified: Secondary | ICD-10-CM | POA: Diagnosis not present

## 2020-04-06 DIAGNOSIS — Z01818 Encounter for other preprocedural examination: Secondary | ICD-10-CM | POA: Diagnosis not present

## 2020-04-06 DIAGNOSIS — Z886 Allergy status to analgesic agent status: Secondary | ICD-10-CM | POA: Insufficient documentation

## 2020-04-06 DIAGNOSIS — I10 Essential (primary) hypertension: Secondary | ICD-10-CM | POA: Insufficient documentation

## 2020-04-06 DIAGNOSIS — I25119 Atherosclerotic heart disease of native coronary artery with unspecified angina pectoris: Secondary | ICD-10-CM | POA: Insufficient documentation

## 2020-04-06 DIAGNOSIS — Z01810 Encounter for preprocedural cardiovascular examination: Secondary | ICD-10-CM

## 2020-04-06 DIAGNOSIS — Z79899 Other long term (current) drug therapy: Secondary | ICD-10-CM | POA: Insufficient documentation

## 2020-04-06 DIAGNOSIS — Z7902 Long term (current) use of antithrombotics/antiplatelets: Secondary | ICD-10-CM | POA: Insufficient documentation

## 2020-04-06 DIAGNOSIS — Z09 Encounter for follow-up examination after completed treatment for conditions other than malignant neoplasm: Secondary | ICD-10-CM

## 2020-04-06 DIAGNOSIS — C3490 Malignant neoplasm of unspecified part of unspecified bronchus or lung: Secondary | ICD-10-CM | POA: Diagnosis not present

## 2020-04-06 HISTORY — PX: TALC PLEURODESIS: SHX2506

## 2020-04-06 HISTORY — PX: VIDEO ASSISTED THORACOSCOPY: SHX5073

## 2020-04-06 HISTORY — PX: CHEST TUBE INSERTION: SHX231

## 2020-04-06 LAB — CBC WITH DIFFERENTIAL/PLATELET
Abs Immature Granulocytes: 0.02 10*3/uL (ref 0.00–0.07)
Basophils Absolute: 0 10*3/uL (ref 0.0–0.1)
Basophils Relative: 1 %
Eosinophils Absolute: 0 10*3/uL (ref 0.0–0.5)
Eosinophils Relative: 0 %
HCT: 38.3 % — ABNORMAL LOW (ref 39.0–52.0)
Hemoglobin: 11.2 g/dL — ABNORMAL LOW (ref 13.0–17.0)
Immature Granulocytes: 1 %
Lymphocytes Relative: 32 %
Lymphs Abs: 1.3 10*3/uL (ref 0.7–4.0)
MCH: 24.6 pg — ABNORMAL LOW (ref 26.0–34.0)
MCHC: 29.2 g/dL — ABNORMAL LOW (ref 30.0–36.0)
MCV: 84.2 fL (ref 80.0–100.0)
Monocytes Absolute: 0.5 10*3/uL (ref 0.1–1.0)
Monocytes Relative: 14 %
Neutro Abs: 2.1 10*3/uL (ref 1.7–7.7)
Neutrophils Relative %: 52 %
Platelets: 418 10*3/uL — ABNORMAL HIGH (ref 150–400)
RBC: 4.55 MIL/uL (ref 4.22–5.81)
RDW: 15.7 % — ABNORMAL HIGH (ref 11.5–15.5)
WBC: 3.9 10*3/uL — ABNORMAL LOW (ref 4.0–10.5)
nRBC: 0 % (ref 0.0–0.2)

## 2020-04-06 LAB — COMPREHENSIVE METABOLIC PANEL
ALT: 11 U/L (ref 0–44)
AST: 16 U/L (ref 15–41)
Albumin: 3.1 g/dL — ABNORMAL LOW (ref 3.5–5.0)
Alkaline Phosphatase: 62 U/L (ref 38–126)
Anion gap: 9 (ref 5–15)
BUN: 7 mg/dL — ABNORMAL LOW (ref 8–23)
CO2: 25 mmol/L (ref 22–32)
Calcium: 8.6 mg/dL — ABNORMAL LOW (ref 8.9–10.3)
Chloride: 104 mmol/L (ref 98–111)
Creatinine, Ser: 0.89 mg/dL (ref 0.61–1.24)
GFR, Estimated: >60 mL/min (ref >60)
Glucose, Bld: 97 mg/dL (ref 70–99)
Potassium: 4 mmol/L (ref 3.5–5.1)
Sodium: 138 mmol/L (ref 135–145)
Total Bilirubin: 0.4 mg/dL (ref 0.3–1.2)
Total Protein: 7.3 g/dL (ref 6.5–8.1)

## 2020-04-06 LAB — APTT: aPTT: 31 seconds (ref 24–36)

## 2020-04-06 LAB — PROTIME-INR
INR: 1 (ref 0.8–1.2)
Prothrombin Time: 12.5 seconds (ref 11.4–15.2)

## 2020-04-06 SURGERY — VIDEO ASSISTED THORACOSCOPY
Anesthesia: General | Laterality: Right

## 2020-04-06 MED ORDER — CHLORHEXIDINE GLUCONATE CLOTH 2 % EX PADS: 6.0000 | MEDICATED_PAD | Freq: Every day | CUTANEOUS | Status: DC

## 2020-04-06 MED ORDER — ESMOLOL HCL 100 MG/10ML IV SOLN
INTRAVENOUS | Status: DC | PRN
  Administered 2020-04-06: 10 mg via INTRAVENOUS

## 2020-04-06 MED ORDER — CEFAZOLIN SODIUM-DEXTROSE 2-4 GM/100ML-% IV SOLN
2.0000 g | INTRAVENOUS | Status: AC
  Administered 2020-04-06: 2 g via INTRAVENOUS

## 2020-04-06 MED ORDER — PHENYLEPHRINE HCL (PRESSORS) 10 MG/ML IV SOLN
INTRAVENOUS | Status: DC | PRN
  Administered 2020-04-06: 200 ug via INTRAVENOUS
  Administered 2020-04-06: 100 ug via INTRAVENOUS

## 2020-04-06 MED ORDER — DEXAMETHASONE SODIUM PHOSPHATE 10 MG/ML IJ SOLN
INTRAMUSCULAR | Status: DC | PRN
  Administered 2020-04-06: 5 mg via INTRAVENOUS

## 2020-04-06 MED ORDER — TALC 5 G PL SUSR
INTRAPLEURAL | Status: DC | PRN
  Administered 2020-04-06: 4 g via INTRAPLEURAL

## 2020-04-06 MED ORDER — MORPHINE SULFATE (PF) 2 MG/ML IV SOLN
1.0000 mg | INTRAVENOUS | Status: DC | PRN
  Administered 2020-04-06: 2 mg via INTRAVENOUS
  Filled 2020-04-06: qty 1

## 2020-04-06 MED ORDER — AMLODIPINE BESYLATE 5 MG PO TABS: 5.0000 mg | ORAL_TABLET | Freq: Every day | ORAL | Status: DC

## 2020-04-06 MED ORDER — CHLORHEXIDINE GLUCONATE 0.12 % MT SOLN
OROMUCOSAL | Status: AC
  Administered 2020-04-06: 15 mL via OROMUCOSAL
  Filled 2020-04-06: qty 15

## 2020-04-06 MED ORDER — HEPARIN SODIUM (PORCINE) 5000 UNIT/ML IJ SOLN
INTRAMUSCULAR | Status: AC
  Filled 2020-04-06: qty 1

## 2020-04-06 MED ORDER — CHLORHEXIDINE GLUCONATE CLOTH 2 % EX PADS
6.0000 | MEDICATED_PAD | Freq: Once | CUTANEOUS | Status: DC
  Administered 2020-04-06: 6 via TOPICAL

## 2020-04-06 MED ORDER — LIDOCAINE HCL (PF) 2 % IJ SOLN
INTRAMUSCULAR | Status: AC
  Filled 2020-04-06: qty 5

## 2020-04-06 MED ORDER — BUPIVACAINE LIPOSOME 1.3 % IJ SUSP
INTRAMUSCULAR | Status: DC | PRN
  Administered 2020-04-06: 20 mL

## 2020-04-06 MED ORDER — CHLORHEXIDINE GLUCONATE 0.12 % MT SOLN: 15.0000 mL | Freq: Once | OROMUCOSAL | Status: AC

## 2020-04-06 MED ORDER — PROPOFOL 10 MG/ML IV BOLUS
INTRAVENOUS | Status: AC
  Filled 2020-04-06: qty 40

## 2020-04-06 MED ORDER — CEFAZOLIN SODIUM-DEXTROSE 2-4 GM/100ML-% IV SOLN
INTRAVENOUS | Status: AC
  Administered 2020-04-06: 2 g via INTRAVENOUS
  Filled 2020-04-06: qty 100

## 2020-04-06 MED ORDER — LACTATED RINGERS IV SOLN: INTRAVENOUS | Status: DC

## 2020-04-06 MED ORDER — FAMOTIDINE 20 MG PO TABS
ORAL_TABLET | ORAL | Status: AC
  Administered 2020-04-06: 20 mg via ORAL
  Filled 2020-04-06: qty 1

## 2020-04-06 MED ORDER — ONDANSETRON HCL 4 MG/2ML IJ SOLN: 4.0000 mg | Freq: Once | INTRAMUSCULAR | Status: DC | PRN

## 2020-04-06 MED ORDER — ROCURONIUM BROMIDE 10 MG/ML (PF) SYRINGE
PREFILLED_SYRINGE | INTRAVENOUS | Status: AC
  Filled 2020-04-06: qty 10

## 2020-04-06 MED ORDER — DEXTROSE-NACL 5-0.45 % IV SOLN: INTRAVENOUS | Status: DC

## 2020-04-06 MED ORDER — FENTANYL CITRATE (PF) 100 MCG/2ML IJ SOLN
INTRAMUSCULAR | Status: DC | PRN
  Administered 2020-04-06 (×2): 50 ug via INTRAVENOUS

## 2020-04-06 MED ORDER — CEFAZOLIN SODIUM-DEXTROSE 2-4 GM/100ML-% IV SOLN
2.0000 g | Freq: Three times a day (TID) | INTRAVENOUS | Status: AC
  Administered 2020-04-07: 2 g via INTRAVENOUS
  Filled 2020-04-06 (×2): qty 100

## 2020-04-06 MED ORDER — FENTANYL CITRATE (PF) 100 MCG/2ML IJ SOLN
INTRAMUSCULAR | Status: AC
  Filled 2020-04-06: qty 2

## 2020-04-06 MED ORDER — BISACODYL 5 MG PO TBEC: 10.0000 mg | DELAYED_RELEASE_TABLET | Freq: Every day | ORAL | Status: DC

## 2020-04-06 MED ORDER — ACETAMINOPHEN 10 MG/ML IV SOLN
INTRAVENOUS | Status: AC
  Filled 2020-04-06: qty 100

## 2020-04-06 MED ORDER — ATORVASTATIN CALCIUM 20 MG PO TABS
80.0000 mg | ORAL_TABLET | Freq: Every day | ORAL | Status: DC
  Administered 2020-04-06: 80 mg via ORAL
  Filled 2020-04-06: qty 4

## 2020-04-06 MED ORDER — FAMOTIDINE 20 MG PO TABS: 20.0000 mg | ORAL_TABLET | Freq: Once | ORAL | Status: AC

## 2020-04-06 MED ORDER — ACETAMINOPHEN 10 MG/ML IV SOLN
INTRAVENOUS | Status: DC | PRN
  Administered 2020-04-06: 1000 mg via INTRAVENOUS

## 2020-04-06 MED ORDER — ONDANSETRON HCL 4 MG/2ML IJ SOLN: 4.0000 mg | Freq: Four times a day (QID) | INTRAMUSCULAR | Status: DC | PRN

## 2020-04-06 MED ORDER — PHENYLEPHRINE HCL (PRESSORS) 10 MG/ML IV SOLN
INTRAVENOUS | Status: AC
  Filled 2020-04-06: qty 1

## 2020-04-06 MED ORDER — TALC (STERITALC) POWDER FOR INTRAPLEURAL USE
INTRAPLEURAL | Status: AC
  Filled 2020-04-06: qty 4

## 2020-04-06 MED ORDER — ALBUTEROL SULFATE (2.5 MG/3ML) 0.083% IN NEBU
2.5000 mg | INHALATION_SOLUTION | RESPIRATORY_TRACT | Status: DC
  Administered 2020-04-06 – 2020-04-07 (×3): 2.5 mg via RESPIRATORY_TRACT
  Filled 2020-04-06 (×4): qty 3

## 2020-04-06 MED ORDER — FENTANYL CITRATE (PF) 100 MCG/2ML IJ SOLN
25.0000 ug | INTRAMUSCULAR | Status: AC | PRN
  Administered 2020-04-06 (×6): 25 ug via INTRAVENOUS

## 2020-04-06 MED ORDER — ORAL CARE MOUTH RINSE: 15.0000 mL | Freq: Once | OROMUCOSAL | Status: AC

## 2020-04-06 MED ORDER — GLYCOPYRROLATE 0.2 MG/ML IJ SOLN
INTRAMUSCULAR | Status: DC | PRN
  Administered 2020-04-06: .2 mg via INTRAVENOUS

## 2020-04-06 MED ORDER — BUPIVACAINE HCL (PF) 0.25 % IJ SOLN
INTRAMUSCULAR | Status: AC
  Filled 2020-04-06: qty 30

## 2020-04-06 MED ORDER — FENTANYL CITRATE (PF) 100 MCG/2ML IJ SOLN
INTRAMUSCULAR | Status: AC
  Administered 2020-04-06: 25 ug via INTRAVENOUS
  Filled 2020-04-06: qty 2

## 2020-04-06 MED ORDER — ONDANSETRON HCL 4 MG/2ML IJ SOLN
INTRAMUSCULAR | Status: DC | PRN
  Administered 2020-04-06: 4 mg via INTRAVENOUS

## 2020-04-06 MED ORDER — BUPIVACAINE HCL 0.25 % IJ SOLN
INTRAMUSCULAR | Status: DC | PRN
  Administered 2020-04-06: 30 mL

## 2020-04-06 MED ORDER — PROPOFOL 10 MG/ML IV BOLUS
INTRAVENOUS | Status: DC | PRN
  Administered 2020-04-06: 160 mg via INTRAVENOUS

## 2020-04-06 MED ORDER — LIDOCAINE HCL (CARDIAC) PF 100 MG/5ML IV SOSY
PREFILLED_SYRINGE | INTRAVENOUS | Status: DC | PRN
  Administered 2020-04-06: 80 mg via INTRAVENOUS

## 2020-04-06 MED ORDER — ONDANSETRON HCL 4 MG/2ML IJ SOLN
INTRAMUSCULAR | Status: AC
  Filled 2020-04-06: qty 2

## 2020-04-06 MED ORDER — BUPIVACAINE LIPOSOME 1.3 % IJ SUSP
INTRAMUSCULAR | Status: AC
  Filled 2020-04-06: qty 20

## 2020-04-06 MED ORDER — MORPHINE SULFATE 15 MG PO TABS
15.0000 mg | ORAL_TABLET | Freq: Four times a day (QID) | ORAL | Status: DC | PRN
  Administered 2020-04-06 – 2020-04-07 (×2): 15 mg via ORAL
  Filled 2020-04-06 (×3): qty 1

## 2020-04-06 MED ORDER — SUGAMMADEX SODIUM 200 MG/2ML IV SOLN
INTRAVENOUS | Status: DC | PRN
  Administered 2020-04-06: 300 mg via INTRAVENOUS

## 2020-04-06 MED ORDER — SODIUM CHLORIDE (PF) 0.9 % IJ SOLN
INTRAMUSCULAR | Status: AC
  Filled 2020-04-06: qty 50

## 2020-04-06 MED ORDER — ROCURONIUM BROMIDE 100 MG/10ML IV SOLN
INTRAVENOUS | Status: DC | PRN
  Administered 2020-04-06: 60 mg via INTRAVENOUS

## 2020-04-06 SURGICAL SUPPLY — 89 items
ADH SKN CLS APL DERMABOND .7 (GAUZE/BANDAGES/DRESSINGS) ×1
APL PRP STRL LF DISP 70% ISPRP (MISCELLANEOUS) ×2
BLADE SURG SZ11 CARB STEEL (BLADE) ×2 IMPLANT
BRONCHOSCOPE BFLEX 3.8 (MISCELLANEOUS) ×2 IMPLANT
CANISTER SUCT 1200ML W/VALVE (MISCELLANEOUS) ×2 IMPLANT
CANISTER SUCT 3000ML PPV (MISCELLANEOUS) ×1 IMPLANT
CATH URET ROBINSON 16FR STRL (CATHETERS) IMPLANT
CHLORAPREP W/TINT 26 (MISCELLANEOUS) ×4 IMPLANT
CONN 3/8X3/8 STRT W/LEUR LOCK (CONNECTOR)
CONN REDUCER 1/4X3/8 STR (CONNECTOR) ×2
CONNECTOR 3/8X3/8 STRT W/LLCK (CONNECTOR) IMPLANT
CONNECTOR REDUCER 1/4X3/8 STR (CONNECTOR) IMPLANT
CUTTER ECHEON FLEX ENDO 45 340 (ENDOMECHANICALS) IMPLANT
DEFOGGER SCOPE WARMER CLEARIFY (MISCELLANEOUS) ×2 IMPLANT
DERMABOND ADVANCED (GAUZE/BANDAGES/DRESSINGS) ×1
DERMABOND ADVANCED .7 DNX12 (GAUZE/BANDAGES/DRESSINGS) IMPLANT
DRAIN CHANNEL 28F RND 3/8 FF (WOUND CARE) ×1 IMPLANT
DRAIN CHEST DRY SUCT SGL (MISCELLANEOUS) ×2 IMPLANT
DRAPE C-SECTION (MISCELLANEOUS) ×2 IMPLANT
DRAPE INCISE IOBAN 66X45 STRL (DRAPES) ×2 IMPLANT
DRAPE LAPAROTOMY 77X122 PED (DRAPES) ×2 IMPLANT
DRAPE MAG INST 16X20 L/F (DRAPES) ×2 IMPLANT
DRSG OPSITE POSTOP 3X4 (GAUZE/BANDAGES/DRESSINGS) ×6 IMPLANT
DRSG TEGADERM 4X4.75 (GAUZE/BANDAGES/DRESSINGS) ×5 IMPLANT
ELECT BLADE 6 FLAT ULTRCLN (ELECTRODE) ×2 IMPLANT
ELECT BLADE 6.5 EXT (BLADE) ×2 IMPLANT
ELECT CAUTERY BLADE TIP 2.5 (TIP) ×2
ELECT REM PT RETURN 9FT ADLT (ELECTROSURGICAL) ×2
ELECTRODE CAUTERY BLDE TIP 2.5 (TIP) ×1 IMPLANT
ELECTRODE REM PT RTRN 9FT ADLT (ELECTROSURGICAL) ×1 IMPLANT
GAUZE 4X4 16PLY RFD (DISPOSABLE) ×1 IMPLANT
GAUZE SPONGE 4X4 12PLY STRL (GAUZE/BANDAGES/DRESSINGS) ×2 IMPLANT
GLOVE SURG SYN 7.5  E (GLOVE) ×4
GLOVE SURG SYN 7.5 E (GLOVE) ×2 IMPLANT
GLOVE SURG SYN 7.5 PF PI (GLOVE) ×2 IMPLANT
GOWN STRL REUS W/ TWL LRG LVL3 (GOWN DISPOSABLE) ×4 IMPLANT
GOWN STRL REUS W/TWL LRG LVL3 (GOWN DISPOSABLE) ×8
KIT PLEURX DRAIN CATH 15.5FR (DRAIN) ×2 IMPLANT
KIT TURNOVER KIT A (KITS) ×2 IMPLANT
LABEL OR SOLS (LABEL) ×2 IMPLANT
LOOP RED MAXI  1X406MM (MISCELLANEOUS)
LOOP VESSEL MAXI  1X406 RED (MISCELLANEOUS)
LOOP VESSEL MAXI 1X406 RED (MISCELLANEOUS) IMPLANT
MARKER SKIN DUAL TIP RULER LAB (MISCELLANEOUS) ×4 IMPLANT
NDL FILTER BLUNT 18X1 1/2 (NEEDLE) ×1 IMPLANT
NDL SPNL 20GX3.5 QUINCKE YW (NEEDLE) ×1 IMPLANT
NEEDLE FILTER BLUNT 18X 1/2SAF (NEEDLE) ×1
NEEDLE FILTER BLUNT 18X1 1/2 (NEEDLE) ×1 IMPLANT
NEEDLE SPNL 20GX3.5 QUINCKE YW (NEEDLE) ×2 IMPLANT
NS IRRIG 500ML POUR BTL (IV SOLUTION) ×2 IMPLANT
PACK BASIN MAJOR ARMC (MISCELLANEOUS) ×2 IMPLANT
PACK BASIN MINOR (MISCELLANEOUS) ×2 IMPLANT
SCISSORS METZENBAUM CVD 33 (INSTRUMENTS) IMPLANT
SPONGE KITTNER 5P (MISCELLANEOUS) ×2 IMPLANT
STAPLER SKIN PROX 35W (STAPLE) IMPLANT
STAPLER VASCULAR ECHELON 35 (CUTTER) IMPLANT
STRIP CLOSURE SKIN 1/2X4 (GAUZE/BANDAGES/DRESSINGS) IMPLANT
SUCTION FRAZIER HANDLE 10FR (MISCELLANEOUS) ×2
SUCTION TUBE FRAZIER 10FR DISP (MISCELLANEOUS) ×1 IMPLANT
SUT ETHILON 3-0 FS-10 30 BLK (SUTURE) ×2
SUT ETHILON 4-0 (SUTURE)
SUT ETHILON 4-0 FS2 18XMFL BLK (SUTURE)
SUT MNCRL 4-0 (SUTURE) ×4
SUT MNCRL 4-0 27XMFL (SUTURE) ×2
SUT SILK 1 SH (SUTURE) ×7 IMPLANT
SUT VIC AB 0 CT1 36 (SUTURE) ×4 IMPLANT
SUT VIC AB 0 SH 27 (SUTURE) ×2 IMPLANT
SUT VIC AB 2-0 CT1 27 (SUTURE) ×4
SUT VIC AB 2-0 CT1 TAPERPNT 27 (SUTURE) ×2 IMPLANT
SUT VIC AB 2-0 SH 27 (SUTURE) ×2
SUT VIC AB 2-0 SH 27XBRD (SUTURE) ×1 IMPLANT
SUT VIC AB 3-0 SH 27 (SUTURE)
SUT VIC AB 3-0 SH 27X BRD (SUTURE) IMPLANT
SUT VICRYL 2 TP 1 (SUTURE) IMPLANT
SUTURE EHLN 3-0 FS-10 30 BLK (SUTURE) ×1 IMPLANT
SUTURE ETHLN 4-0 FS2 18XMF BLK (SUTURE) IMPLANT
SUTURE MNCRL 4-0 27XMF (SUTURE) ×2 IMPLANT
SYR 20ML LL LF (SYRINGE) ×4 IMPLANT
SYR 30ML LL (SYRINGE) ×2 IMPLANT
SYR BULB IRRIG 60ML STRL (SYRINGE) ×2 IMPLANT
SYR TOOMEY IRRIG 70ML (MISCELLANEOUS) ×2
SYRINGE TOOMEY IRRIG 70ML (MISCELLANEOUS) IMPLANT
TAPE CLOTH 3X10 WHT NS LF (GAUZE/BANDAGES/DRESSINGS) ×2 IMPLANT
TAPE TRANSPORE STRL 2 31045 (GAUZE/BANDAGES/DRESSINGS) ×1 IMPLANT
TRAY FOLEY MTR SLVR 16FR STAT (SET/KITS/TRAYS/PACK) ×2 IMPLANT
TROCAR FLEXIPATH 20X80 (ENDOMECHANICALS) IMPLANT
TROCAR FLEXIPATH THORACIC 15MM (ENDOMECHANICALS) IMPLANT
WATER STERILE IRR 1000ML POUR (IV SOLUTION) ×2 IMPLANT
YANKAUER SUCT BULB TIP FLEX NO (MISCELLANEOUS) ×2 IMPLANT

## 2020-04-06 NOTE — Transfer of Care (Signed)
Immediate Anesthesia Transfer of Care Note  Patient: Ryan Harvey  Procedure(s) Performed: VIDEO ASSISTED THORACOSCOPY (Right ) Pleurx Catheter insertion (Right ) TALC PLEURADESIS (Right )  Patient Location: PACU  Anesthesia Type:General  Level of Consciousness: awake, alert , drowsy and patient cooperative  Airway & Oxygen Therapy: Patient Spontanous Breathing and Patient connected to face mask oxygen  Post-op Assessment: Report given to RN and Post -op Vital signs reviewed and stable  Post vital signs: Reviewed and stable  Last Vitals:  Vitals Value Taken Time  BP 172/78 04/06/20 1208  Temp 36.1 C 04/06/20 1207  Pulse 77 04/06/20 1217  Resp 19 04/06/20 1217  SpO2 100 % 04/06/20 1217  Vitals shown include unvalidated device data.  Last Pain:  Vitals:   04/06/20 1207  TempSrc:   PainSc: Asleep         Complications: No complications documented.

## 2020-04-06 NOTE — Op Note (Signed)
  04/06/2020  1:35 PM  PATIENT:  Ryan Harvey  74 y.o. male  PRE-OPERATIVE DIAGNOSIS: Malignant pleural effusion right side  POST-OPERATIVE DIAGNOSIS: Malignant pleural effusion right side  PROCEDURE: Preoperative bronchoscopy to assess endobronchial anatomy; right thoracoscopy with talc pleurodesis; insertion of tunneled pleural catheter  SURGEON:  Surgeon(s) and Role:    [name], MD - Primary  ASSISTANTS: Loree Fee, PA  ANESTHESIA: General  INDICATIONS FOR PROCEDURE this patient is a 74 year old gentleman who was recently diagnosed with non-small cell carcinoma of the lung.  He has undergone 1 cycle of chemotherapy and has developed a malignant pleural effusion on the right.  This has been tapped on 2 separate occasions and is recurred on both times.  He states he had significant relief with his thoracentesis.  The indications and risks of the above procedure was explained in detail the patient gives informed consent.  DICTATION: The patient was brought to the operating suite and placed in the supine position.  General endotracheal anesthesia was given through a double-lumen tube.  Preoperative bronchoscopy was carried out.  The left lung was normal.  The right lower lobe bronchial orifice ease appeared slightly narrowed.  There was no endobronchial tumor.  The tube was positioned in the left mainstem bronchus and the patient was turned for a right thoracoscopy.  All pressure points were carefully padded.  The patient was prepped and draped in usual sterile fashion.  A 20 mm incision was made at approximately the sixth or seventh intercostal space.  Incision was deepened down through the muscles of the chest wall until the pleural space was entered.  We then suction 2.3 L of slightly bloody fluid from the right pleural space.  Thoracoscope was introduced and we could see that there is extensive tumor involving the parietal pleura.  This was most pronounced posteriorly and at the  midportion of the chest.  The chest was suctioned dry and then 4 g of sterile talc were then insufflated under direct visualization.  The entire pleural space was coated.  A 28 Blake drain was positioned along the paravertebral space and brought out through a separate stab wound inferiorly.  Likewise a Pleurx catheter was inserted and also brought out through a separate stab wound.  The wounds were then closed with multiple layers running absorbable sutures.  Sterile dressings were applied.  The patient was then rolled in the supine position where he was extubated and taken to the recovery room in stable condition.     [name], MD
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
## 2020-04-06 NOTE — Anesthesia Procedure Notes (Signed)
Procedure Name: Intubation Date/Time: 04/06/2020 10:47 AM Performed by: Anders Simmonds, RN Pre-anesthesia Checklist: Patient identified, Emergency Drugs available, Suction available and Patient being monitored Patient Re-evaluated:Patient Re-evaluated prior to induction Oxygen Delivery Method: Circle system utilized Preoxygenation: Pre-oxygenation with 100% oxygen Induction Type: IV induction Ventilation: Mask ventilation without difficulty and Oral airway inserted - appropriate to patient size Laryngoscope Size: Glidescope and 4 Grade View: Grade I Tube type: Oral Endobronchial tube: Double lumen EBT and Left and 39 Fr Number of attempts: 1 Airway Equipment and Method: Stylet and Video-laryngoscopy Placement Confirmation: ETT inserted through vocal cords under direct vision,  positive ETCO2 and breath sounds checked- equal and bilateral Secured at: 31 cm Tube secured with: Tape Dental Injury: Teeth and Oropharynx as per pre-operative assessment

## 2020-04-06 NOTE — Interval H&P Note (Signed)
History and Physical Interval Note:  04/06/2020 9:54 AM  Ryan Harvey  has presented today for surgery, with the diagnosis of Lung cancer.  The various methods of treatment have been discussed with the patient and family. After consideration of risks, benefits and other options for treatment, the patient has consented to  Procedure(s): VIDEO ASSISTED THORACOSCOPY (Right) Pleurx Catheter insertion (Right) as a surgical intervention.  The patient's history has been reviewed, patient examined, no change in status, stable for surgery.  I have reviewed the patient's chart and labs.  Questions were answered to the patient's satisfaction.     [name]

## 2020-04-06 NOTE — Care Management CC44 (Signed)
Condition Code 44 Documentation Completed  Patient Details  Name: Ryan Harvey MRN: [id number] Date of Birth: 1946-04-10   Condition Code 44 given:  Yes Patient signature on Condition Code 44 notice:  Yes Documentation of 2 MD's agreement:  Yes Code 44 added to claim:  Yes  Spoke with patient on phone to explain Code 44 status.  Victorino Dike, RN 04/06/2020, 2:34 PM

## 2020-04-07 ENCOUNTER — Encounter: Payer: Self-pay | Admitting: Cardiothoracic Surgery

## 2020-04-07 DIAGNOSIS — C3431 Malignant neoplasm of lower lobe, right bronchus or lung: Secondary | ICD-10-CM | POA: Diagnosis not present

## 2020-04-07 NOTE — Progress Notes (Signed)
Ryan Harvey to be D/C'd Home per MD order.  Discussed prescriptions and follow up appointments with the patient. Prescriptions given to patient and five pleurix drains. Medication list explained in detail. Pt verbalized understanding and demonstrated drain with teachback.  Allergies as of 04/07/2020      Reactions   Aspirin Other (See Comments)   Causes bleeding through his nose   Oxycodone Itching, Rash   Shellfish Allergy Rash, Swelling      Medication List    TAKE these medications   amLODipine 5 MG tablet Commonly known as: NORVASC Take 5 mg by mouth every morning.   atorvastatin 80 MG tablet Commonly known as: Lipitor Take 1 tablet (80 mg total) by mouth daily.   clopidogrel 75 MG tablet Commonly known as: PLAVIX Take 1 tablet (75 mg total) by mouth daily with breakfast.   dexamethasone 4 MG tablet Commonly known as: DECADRON Take 1 tab two times a day the day before Alimta chemo, then take 2 tabs once a day for 3 days starting the day after cisplatin.   folic acid 1 MG tablet Commonly known as: FOLVITE Take 1 tablet (1 mg total) by mouth daily. Start 5-7 days before Alimta chemotherapy. Continue until 21 days after Alimta completed.   lidocaine-prilocaine cream Commonly known as: EMLA Apply to affected area once What changed:   how much to take  how to take this  when to take this  reasons to take this   morphine 15 MG tablet Commonly known as: MSIR Take 1 tablet (15 mg total) by mouth every 6 (six) hours as needed for severe pain.   ondansetron 8 MG tablet Commonly known as: Zofran Take 1 tablet (8 mg total) by mouth 2 (two) times daily as needed (Nausea or vomiting). Start if needed on the third day after cisplatin.   prochlorperazine 10 MG tablet Commonly known as: COMPAZINE Take 1 tablet (10 mg total) by mouth every 6 (six) hours as needed (Nausea or vomiting).            Discharge Care Instructions  (From admission, onward)          Start     Ordered   04/07/20 0000  Discharge wound care:       Comments: Leave dressing on till Thursday evening and then change dressing and drain the PleurX catheter.  Do this daily.   04/07/20 1021          Vitals:   04/07/20 1113 04/07/20 1203  BP:  (!) 128/46  Pulse:  71  Resp:  16  Temp:  97.7 F (36.5 C)  SpO2: 98% 99%    Skin clean, dry and intact without evidence of skin break down, no evidence of skin tears noted. IV catheter discontinued intact. Site without signs and symptoms of complications. Dressing and pressure applied. Pt denies pain at this time. No complaints noted.  An After Visit Summary was printed and given to the patient. Patient escorted via [place], and D/C home via private auto.  Fuller Mandril, RN

## 2020-04-07 NOTE — Discharge Summary (Signed)
Physician Discharge Summary  Patient ID: Ryan Harvey MRN: [id number] DOB/AGE: 03/13/1946 74 y.o.  Admit date: 04/06/2020 Discharge date: 04/07/2020   Discharge Diagnoses:  Active Problems:   Cancer of lower lobe of right lung [facility])   Procedures: Right thoracoscopy with talc pleurodesis and insertion of Pleurx catheter  Hospital Course: The patient was admitted to the hospital after undergoing a thoracoscopy with talc pleurodesis and insertion of a Pleurx catheter.  Overnight his catheter drained approximately 400 cc.  There is no air leak in his chest tube was removed leaving him with a Pleurx catheter in place.  All of his wounds were clean and dry.  His significant other was instructed on how to manage the Pleurx catheter and he was discharged to home today.  He will return in 3 days for his chemotherapy.  He will return in 5 days to see me in the office.  He will have a chest x-ray made when he is seen in our office.  Disposition: Discharge disposition: 01-Home or Self Care       Discharge Instructions    Diet general   Complete by: As directed    Discharge wound care:   Complete by: As directed    Leave dressing on till Thursday evening and then change dressing and drain the PleurX catheter.  Do this daily.   Increase activity slowly   Complete by: As directed      Allergies as of 04/07/2020      Reactions   Aspirin Other (See Comments)   Causes bleeding through his nose   Oxycodone Itching, Rash   Shellfish Allergy Rash, Swelling      Medication List    TAKE these medications   amLODipine 5 MG tablet Commonly known as: NORVASC Take 5 mg by mouth every morning.   atorvastatin 80 MG tablet Commonly known as: Lipitor Take 1 tablet (80 mg total) by mouth daily.   clopidogrel 75 MG tablet Commonly known as: PLAVIX Take 1 tablet (75 mg total) by mouth daily with breakfast.   dexamethasone 4 MG tablet Commonly known as: DECADRON Take 1 tab two times a day  the day before Alimta chemo, then take 2 tabs once a day for 3 days starting the day after cisplatin.   folic acid 1 MG tablet Commonly known as: FOLVITE Take 1 tablet (1 mg total) by mouth daily. Start 5-7 days before Alimta chemotherapy. Continue until 21 days after Alimta completed.   lidocaine-prilocaine cream Commonly known as: EMLA Apply to affected area once What changed:   how much to take  how to take this  when to take this  reasons to take this   morphine 15 MG tablet Commonly known as: MSIR Take 1 tablet (15 mg total) by mouth every 6 (six) hours as needed for severe pain.   ondansetron 8 MG tablet Commonly known as: Zofran Take 1 tablet (8 mg total) by mouth 2 (two) times daily as needed (Nausea or vomiting). Start if needed on the third day after cisplatin.   prochlorperazine 10 MG tablet Commonly known as: COMPAZINE Take 1 tablet (10 mg total) by mouth every 6 (six) hours as needed (Nausea or vomiting).            Discharge Care Instructions  (From admission, onward)         Start     Ordered   04/07/20 0000  Discharge wound care:       Comments: Leave dressing on till Thursday  evening and then change dressing and drain the PleurX catheter.  Do this daily.   04/07/20 [place], MD

## 2020-04-07 NOTE — Plan of Care (Signed)
Pain controlled. Pt on RA. No signs of acute distress.

## 2020-04-07 NOTE — Progress Notes (Signed)
No problems overnight.    Drained about 400 cc postop, serous fluid.  No air leak  Removed his chest tube and instructed his lady friend on how to manage the PleurX drain  Will discharge to home.  Wound care instructions given.  Will see me in one week.

## 2020-04-07 NOTE — TOC Initial Note (Signed)
Transition of Care [facility]) - Initial/Assessment Note    Patient Details  Name: Ryan Harvey MRN: [id number] Date of Birth: 1945/09/19  Transition of Care [facility]) CM/SW Contact:    Beverly Sessions, RN Phone Number: 04/07/2020, 3:50 PM  Clinical Narrative:                  Patient discharging today.  His friend [name] is at bedside.  Patient will be discharging to [facility] home at [place] [address]   PCP [employer].  [name] will be transporting him to appointments  Denies issues obtaining medications  Patient to discharge home with home health RN for pleurx management.  Patient in agreement and states he does not have a preference of home health agency. Referral made and accepted by [facility] with [name].  Patient provided with 5 pleurx canisters prior to discharge    Expected Discharge Plan: [place] Barriers to Discharge: No Barriers Identified   Patient Goals and CMS Choice        Expected Discharge Plan and Services Expected Discharge Plan: [place]   Discharge Planning Services: CM Consult   Living arrangements for the past 2 months: Single Family Home Expected Discharge Date: 04/07/20                         HH Arranged: RN [place] Agency: [place] Date [facility] Agency Contacted: 04/07/20   Representative spoke with at [place]: [name]  Prior Living Arrangements/Services Living arrangements for the past 2 months: [place] Lives with:: Roommate Patient language and need for interpreter reviewed:: Yes Do you feel safe going back to the place where you live?: Yes      Need for Family Participation in Patient Care: Yes (Comment) Care giver support system in place?: Yes (comment)   Criminal Activity/Legal Involvement Pertinent to Current Situation/Hospitalization: No - Comment as needed  Activities of Daily Living Home Assistive Devices/Equipment: None ADL Screening (condition at time of  admission) Patient's cognitive ability adequate to safely complete daily activities?: Yes Is the patient deaf or have difficulty hearing?: No Does the patient have difficulty seeing, even when wearing glasses/contacts?: No Does the patient have difficulty concentrating, remembering, or making decisions?: No Patient able to express need for assistance with ADLs?: No Does the patient have difficulty dressing or bathing?: No Independently performs ADLs?: Yes (appropriate for developmental age) Does the patient have difficulty walking or climbing stairs?: No Weakness of Legs: None Weakness of Arms/Hands: None  Permission Sought/Granted                  Emotional Assessment Appearance:: Appears stated age     Orientation: : Oriented to Self, Oriented to Place, Oriented to  Time, Oriented to Situation   Psych Involvement: No (comment)  Admission diagnosis:  Cancer of lower lobe of right lung [facility]) [C34.31] Patient Active Problem List   Diagnosis Date Noted  . Cancer of lower lobe of right lung ([place]) 04/06/2020  . Stage IV adenocarcinoma of lung, right ([place]) 03/12/2020  . Goals of care, counseling/discussion 03/02/2020  . Malignant pleural effusion 03/02/2020  . Elevated d-dimer 02/12/2020  . HTN (hypertension) 02/12/2020  . Pleural effusion 02/12/2020  . Pneumonia 02/12/2020  . Current use of long term anticoagulation 08/15/2018  . Injury of left shoulder 08/15/2018  . B12 deficiency 07/30/2018  . Vitamin D deficiency 07/30/2018  . Iron deficiency anemia  due to chronic blood loss 07/09/2018  . Hyperlipidemia, mixed 06/23/2016  . CAD (coronary artery disease) 06/07/2016  . Stable angina ([place]) 05/23/2016  . Benign essential HTN 04/17/2016   PCP:  Donnamarie Rossetti, PA-C Pharmacy:   [facility] [address] ([address]), [place] [address] [place])  [address] Phone: [phone] Fax: [phone]     Social  Determinants of Health (SDOH) Interventions    Readmission Risk Interventions No flowsheet data found.

## 2020-04-08 NOTE — Anesthesia Postprocedure Evaluation (Signed)
Anesthesia Post Note  Patient: Ryan Harvey  Procedure(s) Performed: VIDEO ASSISTED THORACOSCOPY (Right ) Pleurx Catheter insertion (Right ) TALC PLEURADESIS (Right )  Patient location during evaluation: PACU Anesthesia Type: General Level of consciousness: awake and alert Pain management: pain level controlled Vital Signs Assessment: post-procedure vital signs reviewed and stable Respiratory status: spontaneous breathing, nonlabored ventilation, respiratory function stable and patient connected to nasal cannula oxygen Cardiovascular status: blood pressure returned to baseline and stable Postop Assessment: no apparent nausea or vomiting Anesthetic complications: no   No complications documented.   Last Vitals:  Vitals:   04/07/20 1113 04/07/20 1203  BP:  (!) 128/46  Pulse:  71  Resp:  16  Temp:  36.5 C  SpO2: 98% 99%    Last Pain:  Vitals:   04/07/20 1203  TempSrc: Oral  PainSc:                  Molli Barrows

## 2020-04-09 ENCOUNTER — Encounter: Payer: Self-pay | Admitting: *Deleted

## 2020-04-09 ENCOUNTER — Other Ambulatory Visit: Payer: Self-pay

## 2020-04-09 ENCOUNTER — Encounter: Payer: Self-pay | Admitting: Oncology

## 2020-04-09 ENCOUNTER — Inpatient Hospital Stay: Payer: Medicare HMO

## 2020-04-09 ENCOUNTER — Telehealth: Payer: Self-pay

## 2020-04-09 ENCOUNTER — Inpatient Hospital Stay (HOSPITAL_BASED_OUTPATIENT_CLINIC_OR_DEPARTMENT_OTHER): Payer: Medicare HMO | Admitting: Oncology

## 2020-04-09 VITALS — BP 140/65 | HR 75 | Temp 98.0°F | Resp 16 | Ht 69.0 in | Wt 163.0 lb

## 2020-04-09 DIAGNOSIS — C3491 Malignant neoplasm of unspecified part of right bronchus or lung: Secondary | ICD-10-CM | POA: Diagnosis not present

## 2020-04-09 DIAGNOSIS — T451X5A Adverse effect of antineoplastic and immunosuppressive drugs, initial encounter: Secondary | ICD-10-CM | POA: Diagnosis not present

## 2020-04-09 DIAGNOSIS — D6481 Anemia due to antineoplastic chemotherapy: Secondary | ICD-10-CM | POA: Diagnosis not present

## 2020-04-09 DIAGNOSIS — G893 Neoplasm related pain (acute) (chronic): Secondary | ICD-10-CM | POA: Diagnosis not present

## 2020-04-09 DIAGNOSIS — Z5112 Encounter for antineoplastic immunotherapy: Secondary | ICD-10-CM

## 2020-04-09 DIAGNOSIS — C782 Secondary malignant neoplasm of pleura: Secondary | ICD-10-CM | POA: Diagnosis not present

## 2020-04-09 DIAGNOSIS — Z5111 Encounter for antineoplastic chemotherapy: Secondary | ICD-10-CM

## 2020-04-09 DIAGNOSIS — C3431 Malignant neoplasm of lower lobe, right bronchus or lung: Secondary | ICD-10-CM | POA: Diagnosis not present

## 2020-04-09 DIAGNOSIS — Z170 Estrogen receptor positive status [ER+]: Secondary | ICD-10-CM | POA: Diagnosis not present

## 2020-04-09 DIAGNOSIS — J910 Malignant pleural effusion: Secondary | ICD-10-CM | POA: Diagnosis not present

## 2020-04-09 LAB — CBC WITH DIFFERENTIAL/PLATELET
Abs Immature Granulocytes: 0.04 10*3/uL (ref 0.00–0.07)
Basophils Absolute: 0 10*3/uL (ref 0.0–0.1)
Basophils Relative: 0 %
Eosinophils Absolute: 0 10*3/uL (ref 0.0–0.5)
Eosinophils Relative: 0 %
HCT: 33.4 % — ABNORMAL LOW (ref 39.0–52.0)
Hemoglobin: 10.3 g/dL — ABNORMAL LOW (ref 13.0–17.0)
Immature Granulocytes: 0 %
Lymphocytes Relative: 7 %
Lymphs Abs: 0.6 10*3/uL — ABNORMAL LOW (ref 0.7–4.0)
MCH: 25.3 pg — ABNORMAL LOW (ref 26.0–34.0)
MCHC: 30.8 g/dL (ref 30.0–36.0)
MCV: 82.1 fL (ref 80.0–100.0)
Monocytes Absolute: 0.8 10*3/uL (ref 0.1–1.0)
Monocytes Relative: 8 %
Neutro Abs: 8 10*3/uL — ABNORMAL HIGH (ref 1.7–7.7)
Neutrophils Relative %: 85 %
Platelets: 366 10*3/uL (ref 150–400)
RBC: 4.07 MIL/uL — ABNORMAL LOW (ref 4.22–5.81)
RDW: 15.4 % (ref 11.5–15.5)
WBC: 9.4 10*3/uL (ref 4.0–10.5)
nRBC: 0 % (ref 0.0–0.2)

## 2020-04-09 LAB — COMPREHENSIVE METABOLIC PANEL
ALT: 10 U/L (ref 0–44)
AST: 20 U/L (ref 15–41)
Albumin: 2.6 g/dL — ABNORMAL LOW (ref 3.5–5.0)
Alkaline Phosphatase: 62 U/L (ref 38–126)
Anion gap: 8 (ref 5–15)
BUN: 12 mg/dL (ref 8–23)
CO2: 24 mmol/L (ref 22–32)
Calcium: 8 mg/dL — ABNORMAL LOW (ref 8.9–10.3)
Chloride: 102 mmol/L (ref 98–111)
Creatinine, Ser: 0.85 mg/dL (ref 0.61–1.24)
GFR, Estimated: >60 mL/min (ref >60)
Glucose, Bld: 145 mg/dL — ABNORMAL HIGH (ref 70–99)
Potassium: 3.8 mmol/L (ref 3.5–5.1)
Sodium: 134 mmol/L — ABNORMAL LOW (ref 135–145)
Total Bilirubin: 0.4 mg/dL (ref 0.3–1.2)
Total Protein: 7 g/dL (ref 6.5–8.1)

## 2020-04-09 MED ORDER — PALONOSETRON HCL INJECTION 0.25 MG/5ML
0.2500 mg | Freq: Once | INTRAVENOUS | Status: AC
  Administered 2020-04-09: 0.25 mg via INTRAVENOUS
  Filled 2020-04-09: qty 5

## 2020-04-09 MED ORDER — SODIUM CHLORIDE 0.9% FLUSH
10.0000 mL | Freq: Once | INTRAVENOUS | Status: AC
  Administered 2020-04-09: 10 mL via INTRAVENOUS
  Filled 2020-04-09: qty 10

## 2020-04-09 MED ORDER — HEPARIN SOD (PORK) LOCK FLUSH 100 UNIT/ML IV SOLN
INTRAVENOUS | Status: AC
  Filled 2020-04-09: qty 5

## 2020-04-09 MED ORDER — HEPARIN SOD (PORK) LOCK FLUSH 100 UNIT/ML IV SOLN
500.0000 [IU] | Freq: Once | INTRAVENOUS | Status: AC
  Administered 2020-04-09: 500 [IU] via INTRAVENOUS
  Filled 2020-04-09: qty 5

## 2020-04-09 MED ORDER — SODIUM CHLORIDE 0.9 % IV SOLN
460.0000 mg | Freq: Once | INTRAVENOUS | Status: AC
  Administered 2020-04-09: 460 mg via INTRAVENOUS
  Filled 2020-04-09: qty 46

## 2020-04-09 MED ORDER — SODIUM CHLORIDE 0.9 % IV SOLN
10.0000 mg | Freq: Once | INTRAVENOUS | Status: AC
  Administered 2020-04-09: 10 mg via INTRAVENOUS
  Filled 2020-04-09: qty 10

## 2020-04-09 MED ORDER — SODIUM CHLORIDE 0.9 % IV SOLN
150.0000 mg | Freq: Once | INTRAVENOUS | Status: AC
  Administered 2020-04-09: 150 mg via INTRAVENOUS
  Filled 2020-04-09: qty 150

## 2020-04-09 MED ORDER — SODIUM CHLORIDE 0.9 % IV SOLN
Freq: Once | INTRAVENOUS | Status: AC
  Filled 2020-04-09: qty 250

## 2020-04-09 MED ORDER — SODIUM CHLORIDE 0.9 % IV SOLN
500.0000 mg/m2 | Freq: Once | INTRAVENOUS | Status: AC
  Administered 2020-04-09: 1000 mg via INTRAVENOUS
  Filled 2020-04-09: qty 40

## 2020-04-09 MED ORDER — SODIUM CHLORIDE 0.9 % IV SOLN
200.0000 mg | Freq: Once | INTRAVENOUS | Status: AC
  Administered 2020-04-09: 200 mg via INTRAVENOUS
  Filled 2020-04-09: qty 8

## 2020-04-09 NOTE — Progress Notes (Signed)
  Oncology Nurse Navigator Documentation  Navigator Location: [facility]-Med Onc (04/09/20 1200)   )Navigator Encounter Type: Follow-up Appt (04/09/20 1200)                     Patient Visit Type: MedOnc (04/09/20 1200) Treatment Phase: Active Tx (04/09/20 1200) Barriers/Navigation Needs: No Barriers At This Time (04/09/20 1200)   Interventions: None Required (04/09/20 1200)             Met with patient during follow up visit with Dr. Janese Banks. All questions answered during visit. No barriers identified at this time. Pt tolerating treatment well. No further needs at this time. Instructed pt to call with any future questions or needs. Pt verbalized understanding.          Time Spent with Patient: 30 (04/09/20 1200)

## 2020-04-09 NOTE — Telephone Encounter (Signed)
Unable to leave message, mailbox full. Patient needs to bring Pleurx catheter kit with him to this appointment 04/12/20.
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
## 2020-04-09 NOTE — Progress Notes (Signed)
Pt says he is doing good, eating good, drinking good. Got the talc pleurodesis. Has dressing on the right side where tube was

## 2020-04-10 DIAGNOSIS — Z48813 Encounter for surgical aftercare following surgery on the respiratory system: Secondary | ICD-10-CM | POA: Diagnosis not present

## 2020-04-10 DIAGNOSIS — Z7902 Long term (current) use of antithrombotics/antiplatelets: Secondary | ICD-10-CM | POA: Diagnosis not present

## 2020-04-10 DIAGNOSIS — Z4801 Encounter for change or removal of surgical wound dressing: Secondary | ICD-10-CM | POA: Diagnosis not present

## 2020-04-10 DIAGNOSIS — C3431 Malignant neoplasm of lower lobe, right bronchus or lung: Secondary | ICD-10-CM | POA: Diagnosis not present

## 2020-04-10 DIAGNOSIS — Z9181 History of falling: Secondary | ICD-10-CM | POA: Diagnosis not present

## 2020-04-10 DIAGNOSIS — Z7952 Long term (current) use of systemic steroids: Secondary | ICD-10-CM | POA: Diagnosis not present

## 2020-04-12 ENCOUNTER — Ambulatory Visit (INDEPENDENT_AMBULATORY_CARE_PROVIDER_SITE_OTHER): Payer: Medicare HMO | Admitting: Cardiothoracic Surgery

## 2020-04-12 ENCOUNTER — Encounter: Payer: Self-pay | Admitting: Cardiothoracic Surgery

## 2020-04-12 ENCOUNTER — Encounter: Payer: Self-pay | Admitting: Oncology

## 2020-04-12 ENCOUNTER — Other Ambulatory Visit: Payer: Self-pay

## 2020-04-12 VITALS — BP 156/72 | HR 87 | Temp 98.2°F | Resp 14 | Ht 69.0 in | Wt 165.0 lb

## 2020-04-12 DIAGNOSIS — C3431 Malignant neoplasm of lower lobe, right bronchus or lung: Secondary | ICD-10-CM | POA: Diagnosis not present

## 2020-04-12 DIAGNOSIS — J910 Malignant pleural effusion: Secondary | ICD-10-CM | POA: Diagnosis not present

## 2020-04-12 LAB — CYTOLOGY - NON PAP

## 2020-04-12 NOTE — Progress Notes (Signed)
Ryan Harvey Follow Up Note  Patient ID: Ryan Harvey, male   DOB: [birth year], 74 y.o.   MRN: [id number]  HISTORY: He returns today in follow-up.  He did drain his Pleurx catheter twice towards the latter part of last week and only was able to extract about 15 mL.  Otherwise he states he has been getting along quite well.  In fact he feels well enough that he would like to travel to [place].    Vitals:   04/12/20 1044  BP: (!) 156/72  Pulse: 87  Resp: 14  Temp: 98.2 F (36.8 C)  SpO2: 93%     EXAM:  Resp: Lungs are clear bilaterally.  No respiratory distress, normal effort. Heart:  Regular without murmurs Abd:  Abdomen is soft, non distended and non tender. No masses are palpable.  There is no rebound and no guarding.  Neurological: Alert and oriented to person, place, and time. Coordination normal.  Skin: Skin is warm and dry. No rash noted. No diaphoretic. No erythema. No pallor.  Psychiatric: Normal mood and affect. Normal behavior. Judgment and thought content normal.    The Pleurx catheter site is clean and dry.  I did remove all of his sutures.  ASSESSMENT/PLAN He will continue to drain the Pleurx every other day.  He will come back and see me in 1 week.  If the catheter only drains a small amount and his chest x-ray looks okay we will remove the catheter.      [name], MD

## 2020-04-12 NOTE — Patient Instructions (Addendum)
Drain on Wednesday and Friday. Do not take the Plavix Friday,Saturday or Sunday. Please see your appointment listed below. Be sure to have the chest xray prior to this appointment.

## 2020-04-13 ENCOUNTER — Encounter: Payer: Self-pay | Admitting: Oncology

## 2020-04-13 DIAGNOSIS — E782 Mixed hyperlipidemia: Secondary | ICD-10-CM | POA: Diagnosis not present

## 2020-04-13 DIAGNOSIS — D500 Iron deficiency anemia secondary to blood loss (chronic): Secondary | ICD-10-CM | POA: Diagnosis not present

## 2020-04-13 DIAGNOSIS — C3431 Malignant neoplasm of lower lobe, right bronchus or lung: Secondary | ICD-10-CM | POA: Diagnosis not present

## 2020-04-13 DIAGNOSIS — I10 Essential (primary) hypertension: Secondary | ICD-10-CM | POA: Diagnosis not present

## 2020-04-13 DIAGNOSIS — I2510 Atherosclerotic heart disease of native coronary artery without angina pectoris: Secondary | ICD-10-CM | POA: Diagnosis not present

## 2020-04-14 DIAGNOSIS — Z7952 Long term (current) use of systemic steroids: Secondary | ICD-10-CM | POA: Diagnosis not present

## 2020-04-14 DIAGNOSIS — Z4801 Encounter for change or removal of surgical wound dressing: Secondary | ICD-10-CM | POA: Diagnosis not present

## 2020-04-14 DIAGNOSIS — Z48813 Encounter for surgical aftercare following surgery on the respiratory system: Secondary | ICD-10-CM | POA: Diagnosis not present

## 2020-04-14 DIAGNOSIS — Z9181 History of falling: Secondary | ICD-10-CM | POA: Diagnosis not present

## 2020-04-14 DIAGNOSIS — Z7902 Long term (current) use of antithrombotics/antiplatelets: Secondary | ICD-10-CM | POA: Diagnosis not present

## 2020-04-14 DIAGNOSIS — C3431 Malignant neoplasm of lower lobe, right bronchus or lung: Secondary | ICD-10-CM | POA: Diagnosis not present

## 2020-04-16 DIAGNOSIS — C3431 Malignant neoplasm of lower lobe, right bronchus or lung: Secondary | ICD-10-CM | POA: Diagnosis not present

## 2020-04-16 DIAGNOSIS — Z4801 Encounter for change or removal of surgical wound dressing: Secondary | ICD-10-CM | POA: Diagnosis not present

## 2020-04-16 DIAGNOSIS — Z7952 Long term (current) use of systemic steroids: Secondary | ICD-10-CM | POA: Diagnosis not present

## 2020-04-16 DIAGNOSIS — Z7902 Long term (current) use of antithrombotics/antiplatelets: Secondary | ICD-10-CM | POA: Diagnosis not present

## 2020-04-16 DIAGNOSIS — Z9181 History of falling: Secondary | ICD-10-CM | POA: Diagnosis not present

## 2020-04-16 DIAGNOSIS — Z48813 Encounter for surgical aftercare following surgery on the respiratory system: Secondary | ICD-10-CM | POA: Diagnosis not present

## 2020-04-19 ENCOUNTER — Ambulatory Visit
Admission: RE | Admit: 2020-04-19 | Discharge: 2020-04-19 | Disposition: A | Discharge status: Home / Self Care | Payer: Medicare HMO | Attending: Cardiothoracic Surgery | Admitting: Cardiothoracic Surgery

## 2020-04-19 ENCOUNTER — Ambulatory Visit
Admission: RE | Admit: 2020-04-19 | Discharge: 2020-04-19 | Disposition: A | Discharge status: Home / Self Care | Payer: Medicare HMO | Source: Ambulatory Visit | Attending: Cardiothoracic Surgery | Admitting: Cardiothoracic Surgery

## 2020-04-19 ENCOUNTER — Encounter: Payer: Self-pay | Admitting: Cardiothoracic Surgery

## 2020-04-19 ENCOUNTER — Other Ambulatory Visit: Payer: Self-pay

## 2020-04-19 ENCOUNTER — Ambulatory Visit (INDEPENDENT_AMBULATORY_CARE_PROVIDER_SITE_OTHER): Payer: Medicare HMO | Admitting: Cardiothoracic Surgery

## 2020-04-19 VITALS — BP 151/71 | HR 87 | Temp 98.4°F | Ht 69.0 in | Wt 168.0 lb

## 2020-04-19 DIAGNOSIS — J910 Malignant pleural effusion: Secondary | ICD-10-CM | POA: Diagnosis not present

## 2020-04-19 DIAGNOSIS — J90 Pleural effusion, not elsewhere classified: Secondary | ICD-10-CM | POA: Diagnosis not present

## 2020-04-19 DIAGNOSIS — C3431 Malignant neoplasm of lower lobe, right bronchus or lung: Secondary | ICD-10-CM

## 2020-04-19 NOTE — Patient Instructions (Addendum)
You may remove the dressing on Wednesday and shower, do not scrub at the area. Rinse well and pat dry. You may use a Band-Aid over the area until it if fully closed.  You may restart your Plavix tomorrow.  If you dressing becomes saturated you may change it as needed.  You may take your Morphine if needed today.   Follow-up with our office as needed.  Please call and ask to speak with a nurse if you develop questions or concerns.

## 2020-04-19 NOTE — Progress Notes (Signed)
[name] Follow Up Note  Patient ID: Ryan Harvey, male   DOB: 1946-06-13, 74 y.o.   MRN: [id number]   [name] returns today in follow-up.  He has drained his catheter 5 times since he has been home.  Each time its been less than 10 cc.  I have independently reviewed his chest x-ray from today.  There is some small amount of fluid at the right base.  He states otherwise he has been getting along very well.  His pain is under good control.  Using sterile technique today I removed his Pleurx drain.  I did not close the wound as there was some crustiness around the skin entrance site.  The catheter was able to be extracted with just some gentle pressure.  He was instructed to leave the dressings intact for 48 hours before getting in the shower.  At that time he should cleanse around the area and then cover it with a sterile Band-Aid.  All of his questions were answered.  I did not make a return visit form but would be happy to see him should the need arise.   [name], MD

## 2020-04-20 DIAGNOSIS — Z7902 Long term (current) use of antithrombotics/antiplatelets: Secondary | ICD-10-CM | POA: Diagnosis not present

## 2020-04-20 DIAGNOSIS — Z48813 Encounter for surgical aftercare following surgery on the respiratory system: Secondary | ICD-10-CM | POA: Diagnosis not present

## 2020-04-20 DIAGNOSIS — Z4801 Encounter for change or removal of surgical wound dressing: Secondary | ICD-10-CM | POA: Diagnosis not present

## 2020-04-20 DIAGNOSIS — C3431 Malignant neoplasm of lower lobe, right bronchus or lung: Secondary | ICD-10-CM | POA: Diagnosis not present

## 2020-04-20 DIAGNOSIS — Z9181 History of falling: Secondary | ICD-10-CM | POA: Diagnosis not present

## 2020-04-20 DIAGNOSIS — Z7952 Long term (current) use of systemic steroids: Secondary | ICD-10-CM | POA: Diagnosis not present

## 2020-04-23 ENCOUNTER — Telehealth: Payer: Self-pay | Admitting: *Deleted

## 2020-04-23 ENCOUNTER — Ambulatory Visit
Admission: RE | Admit: 2020-04-23 | Discharge: 2020-04-23 | Disposition: A | Discharge status: Home / Self Care | Payer: Medicare HMO | Source: Ambulatory Visit | Attending: Oncology | Admitting: Oncology

## 2020-04-23 DIAGNOSIS — C3491 Malignant neoplasm of unspecified part of right bronchus or lung: Secondary | ICD-10-CM | POA: Insufficient documentation

## 2020-04-23 DIAGNOSIS — K7689 Other specified diseases of liver: Secondary | ICD-10-CM | POA: Diagnosis not present

## 2020-04-23 DIAGNOSIS — N281 Cyst of kidney, acquired: Secondary | ICD-10-CM | POA: Diagnosis not present

## 2020-04-23 DIAGNOSIS — J9811 Atelectasis: Secondary | ICD-10-CM | POA: Diagnosis not present

## 2020-04-23 DIAGNOSIS — J929 Pleural plaque without asbestos: Secondary | ICD-10-CM | POA: Diagnosis not present

## 2020-04-23 DIAGNOSIS — M47814 Spondylosis without myelopathy or radiculopathy, thoracic region: Secondary | ICD-10-CM | POA: Diagnosis not present

## 2020-04-23 DIAGNOSIS — C7951 Secondary malignant neoplasm of bone: Secondary | ICD-10-CM | POA: Diagnosis not present

## 2020-04-23 DIAGNOSIS — J90 Pleural effusion, not elsewhere classified: Secondary | ICD-10-CM | POA: Diagnosis not present

## 2020-04-23 MED ORDER — IOHEXOL 300 MG/ML  SOLN
100.0000 mL | Freq: Once | INTRAMUSCULAR | Status: AC | PRN
  Administered 2020-04-23: 100 mL via INTRAVENOUS

## 2020-04-23 NOTE — Telephone Encounter (Signed)
Patient came in today to see if he could get his paper so that he could return to work.  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  I tried calling patient on both phone numbers that were in the computer.  His cell phone number I called went straight to voicemail and said it was full and could not take a message.  The second person on the list [name] was a friend and I called that number and no one answered.  Dr. Janese Banks was fine to fill out the form but it will have some restrictions.  I told patient that Dr. 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  Also she wanted that if he was nauseated having lots of fatigue having pain ; always reasons that he should take it easy and not drive is much.  Patient came in today to talk about the form and said that you do not have to do the DOT number.  He says that he was good after 4 days the first tx.and the second tx. he only needed 3 days before he felt  Better. I told him that I will need to speak to Janese Banks on Monday and see if she is agreeable to what he wants. He would like 4 days and then drive until 1-2 days before chemo starts again. 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 The only thing he does is drive and he has a great comfortable truck. I wil let him know on Monday. Pt agreeable to the plan

## 2020-04-24 DIAGNOSIS — Z48813 Encounter for surgical aftercare following surgery on the respiratory system: Secondary | ICD-10-CM | POA: Diagnosis not present

## 2020-04-24 DIAGNOSIS — Z4801 Encounter for change or removal of surgical wound dressing: Secondary | ICD-10-CM | POA: Diagnosis not present

## 2020-04-24 DIAGNOSIS — C3431 Malignant neoplasm of lower lobe, right bronchus or lung: Secondary | ICD-10-CM | POA: Diagnosis not present

## 2020-04-24 DIAGNOSIS — Z7952 Long term (current) use of systemic steroids: Secondary | ICD-10-CM | POA: Diagnosis not present

## 2020-04-24 DIAGNOSIS — Z7902 Long term (current) use of antithrombotics/antiplatelets: Secondary | ICD-10-CM | POA: Diagnosis not present

## 2020-04-24 DIAGNOSIS — Z9181 History of falling: Secondary | ICD-10-CM | POA: Diagnosis not present
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
## 2020-04-28 DIAGNOSIS — Z9181 History of falling: Secondary | ICD-10-CM | POA: Diagnosis not present

## 2020-04-28 DIAGNOSIS — Z7902 Long term (current) use of antithrombotics/antiplatelets: Secondary | ICD-10-CM | POA: Diagnosis not present

## 2020-04-28 DIAGNOSIS — Z7952 Long term (current) use of systemic steroids: Secondary | ICD-10-CM | POA: Diagnosis not present

## 2020-04-28 DIAGNOSIS — Z48813 Encounter for surgical aftercare following surgery on the respiratory system: Secondary | ICD-10-CM | POA: Diagnosis not present

## 2020-04-28 DIAGNOSIS — C3431 Malignant neoplasm of lower lobe, right bronchus or lung: Secondary | ICD-10-CM | POA: Diagnosis not present

## 2020-04-28 DIAGNOSIS — Z4801 Encounter for change or removal of surgical wound dressing: Secondary | ICD-10-CM | POA: Diagnosis not present

## 2020-04-30 ENCOUNTER — Inpatient Hospital Stay: Payer: Medicare HMO

## 2020-04-30 ENCOUNTER — Inpatient Hospital Stay (HOSPITAL_BASED_OUTPATIENT_CLINIC_OR_DEPARTMENT_OTHER): Payer: Medicare HMO | Admitting: Oncology

## 2020-04-30 ENCOUNTER — Encounter: Payer: Self-pay | Admitting: Oncology

## 2020-04-30 ENCOUNTER — Inpatient Hospital Stay: Payer: Medicare HMO | Attending: Oncology

## 2020-04-30 ENCOUNTER — Other Ambulatory Visit: Payer: Self-pay

## 2020-04-30 VITALS — BP 134/79 | HR 88 | Temp 98.1°F | Resp 16 | Ht 69.0 in | Wt 165.0 lb

## 2020-04-30 DIAGNOSIS — D509 Iron deficiency anemia, unspecified: Secondary | ICD-10-CM | POA: Insufficient documentation

## 2020-04-30 DIAGNOSIS — Z5111 Encounter for antineoplastic chemotherapy: Secondary | ICD-10-CM | POA: Diagnosis not present

## 2020-04-30 DIAGNOSIS — C3491 Malignant neoplasm of unspecified part of right bronchus or lung: Secondary | ICD-10-CM

## 2020-04-30 DIAGNOSIS — I10 Essential (primary) hypertension: Secondary | ICD-10-CM | POA: Insufficient documentation

## 2020-04-30 DIAGNOSIS — C3431 Malignant neoplasm of lower lobe, right bronchus or lung: Secondary | ICD-10-CM

## 2020-04-30 DIAGNOSIS — J910 Malignant pleural effusion: Secondary | ICD-10-CM | POA: Diagnosis not present

## 2020-04-30 DIAGNOSIS — Z79899 Other long term (current) drug therapy: Secondary | ICD-10-CM | POA: Insufficient documentation

## 2020-04-30 DIAGNOSIS — Z87891 Personal history of nicotine dependence: Secondary | ICD-10-CM | POA: Insufficient documentation

## 2020-04-30 DIAGNOSIS — G893 Neoplasm related pain (acute) (chronic): Secondary | ICD-10-CM | POA: Diagnosis not present

## 2020-04-30 DIAGNOSIS — C782 Secondary malignant neoplasm of pleura: Secondary | ICD-10-CM | POA: Diagnosis not present

## 2020-04-30 DIAGNOSIS — E785 Hyperlipidemia, unspecified: Secondary | ICD-10-CM | POA: Insufficient documentation

## 2020-04-30 DIAGNOSIS — Z5112 Encounter for antineoplastic immunotherapy: Secondary | ICD-10-CM

## 2020-04-30 LAB — CBC WITH DIFFERENTIAL/PLATELET
Abs Immature Granulocytes: 0.02 10*3/uL (ref 0.00–0.07)
Basophils Absolute: 0 10*3/uL (ref 0.0–0.1)
Basophils Relative: 0 %
Eosinophils Absolute: 0 10*3/uL (ref 0.0–0.5)
Eosinophils Relative: 0 %
HCT: 34.6 % — ABNORMAL LOW (ref 39.0–52.0)
Hemoglobin: 10.3 g/dL — ABNORMAL LOW (ref 13.0–17.0)
Immature Granulocytes: 0 %
Lymphocytes Relative: 15 %
Lymphs Abs: 0.9 10*3/uL (ref 0.7–4.0)
MCH: 25.1 pg — ABNORMAL LOW (ref 26.0–34.0)
MCHC: 29.8 g/dL — ABNORMAL LOW (ref 30.0–36.0)
MCV: 84.4 fL (ref 80.0–100.0)
Monocytes Absolute: 0.6 10*3/uL (ref 0.1–1.0)
Monocytes Relative: 9 %
Neutro Abs: 4.7 10*3/uL (ref 1.7–7.7)
Neutrophils Relative %: 76 %
Platelets: 311 10*3/uL (ref 150–400)
RBC: 4.1 MIL/uL — ABNORMAL LOW (ref 4.22–5.81)
RDW: 16.7 % — ABNORMAL HIGH (ref 11.5–15.5)
WBC: 6.2 10*3/uL (ref 4.0–10.5)
nRBC: 0 % (ref 0.0–0.2)

## 2020-04-30 LAB — COMPREHENSIVE METABOLIC PANEL
ALT: 19 U/L (ref 0–44)
AST: 21 U/L (ref 15–41)
Albumin: 3.5 g/dL (ref 3.5–5.0)
Alkaline Phosphatase: 56 U/L (ref 38–126)
Anion gap: 9 (ref 5–15)
BUN: 10 mg/dL (ref 8–23)
CO2: 21 mmol/L — ABNORMAL LOW (ref 22–32)
Calcium: 8.9 mg/dL (ref 8.9–10.3)
Chloride: 107 mmol/L (ref 98–111)
Creatinine, Ser: 0.89 mg/dL (ref 0.61–1.24)
GFR, Estimated: >60 mL/min (ref >60)
Glucose, Bld: 107 mg/dL — ABNORMAL HIGH (ref 70–99)
Potassium: 3.7 mmol/L (ref 3.5–5.1)
Sodium: 137 mmol/L (ref 135–145)
Total Bilirubin: 0.5 mg/dL (ref 0.3–1.2)
Total Protein: 7.3 g/dL (ref 6.5–8.1)

## 2020-04-30 LAB — IRON AND TIBC
Iron: 40 ug/dL — ABNORMAL LOW (ref 45–182)
Saturation Ratios: 10 % — ABNORMAL LOW (ref 17.9–39.5)
TIBC: 403 ug/dL (ref 250–450)
UIBC: 363 ug/dL

## 2020-04-30 LAB — VITAMIN B12: Vitamin B-12: 848 pg/mL (ref 180–914)

## 2020-04-30 LAB — FERRITIN: Ferritin: 24 ng/mL (ref 24–336)

## 2020-04-30 LAB — FOLATE: Folate: 31 ng/mL (ref >5.9)

## 2020-04-30 MED ORDER — SODIUM CHLORIDE 0.9 % IV SOLN
500.0000 mg/m2 | Freq: Once | INTRAVENOUS | Status: AC
  Administered 2020-04-30: 1000 mg via INTRAVENOUS
  Filled 2020-04-30: qty 40

## 2020-04-30 MED ORDER — SODIUM CHLORIDE 0.9% FLUSH
10.0000 mL | Freq: Once | INTRAVENOUS | Status: AC
  Administered 2020-04-30: 10 mL via INTRAVENOUS
  Filled 2020-04-30: qty 10

## 2020-04-30 MED ORDER — PALONOSETRON HCL INJECTION 0.25 MG/5ML
0.2500 mg | Freq: Once | INTRAVENOUS | Status: AC
  Administered 2020-04-30: 0.25 mg via INTRAVENOUS
  Filled 2020-04-30: qty 5

## 2020-04-30 MED ORDER — HEPARIN SOD (PORK) LOCK FLUSH 100 UNIT/ML IV SOLN
INTRAVENOUS | Status: AC
  Filled 2020-04-30: qty 5

## 2020-04-30 MED ORDER — SODIUM CHLORIDE 0.9 % IV SOLN
10.0000 mg | Freq: Once | INTRAVENOUS | Status: AC
  Administered 2020-04-30: 10 mg via INTRAVENOUS
  Filled 2020-04-30: qty 10

## 2020-04-30 MED ORDER — SODIUM CHLORIDE 0.9 % IV SOLN
Freq: Once | INTRAVENOUS | Status: AC
  Filled 2020-04-30: qty 250

## 2020-04-30 MED ORDER — SODIUM CHLORIDE 0.9 % IV SOLN
150.0000 mg | Freq: Once | INTRAVENOUS | Status: AC
  Administered 2020-04-30: 150 mg via INTRAVENOUS
  Filled 2020-04-30: qty 150

## 2020-04-30 MED ORDER — SODIUM CHLORIDE 0.9 % IV SOLN
200.0000 mg | Freq: Once | INTRAVENOUS | Status: AC
  Administered 2020-04-30: 200 mg via INTRAVENOUS
  Filled 2020-04-30: qty 8

## 2020-04-30 MED ORDER — CYANOCOBALAMIN 1000 MCG/ML IJ SOLN
1000.0000 ug | Freq: Once | INTRAMUSCULAR | Status: AC
  Administered 2020-04-30: 1000 ug via INTRAMUSCULAR
  Filled 2020-04-30: qty 1

## 2020-04-30 MED ORDER — SODIUM CHLORIDE 0.9 % IV SOLN
460.0000 mg | Freq: Once | INTRAVENOUS | Status: AC
  Administered 2020-04-30: 460 mg via INTRAVENOUS
  Filled 2020-04-30: qty 46

## 2020-04-30 MED ORDER — HEPARIN SOD (PORK) LOCK FLUSH 100 UNIT/ML IV SOLN
500.0000 [IU] | Freq: Once | INTRAVENOUS | Status: AC
  Administered 2020-04-30: 500 [IU] via INTRAVENOUS
  Filled 2020-04-30: qty 5

## 2020-04-30 NOTE — Progress Notes (Signed)
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

## 2020-04-30 NOTE — Progress Notes (Signed)
Pt tolerated tx well, VSS, d/ced home
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
## 2020-05-07 ENCOUNTER — Telehealth: Payer: Self-pay | Admitting: *Deleted

## 2020-05-07 NOTE — Telephone Encounter (Signed)
Called pt and got his voicemail and left message that dr Janese Banks had drawn blood to see if your iron was low since the hgb had been slowing going down. The labs showed that you are getting low on iron which builds the hemoglobin up . So when pt comes in on next visit , we have added a iron treatment on. Pt. Can speak with MD in detail before we give med or not.

## 2020-05-20 ENCOUNTER — Other Ambulatory Visit: Payer: Self-pay | Admitting: *Deleted

## 2020-05-20 DIAGNOSIS — C3491 Malignant neoplasm of unspecified part of right bronchus or lung: Secondary | ICD-10-CM

## 2020-05-21 ENCOUNTER — Inpatient Hospital Stay: Payer: Medicare HMO

## 2020-05-21 ENCOUNTER — Inpatient Hospital Stay: Payer: Medicare HMO | Attending: Oncology

## 2020-05-21 ENCOUNTER — Inpatient Hospital Stay (HOSPITAL_BASED_OUTPATIENT_CLINIC_OR_DEPARTMENT_OTHER): Payer: Medicare HMO | Admitting: Oncology

## 2020-05-21 ENCOUNTER — Encounter: Payer: Self-pay | Admitting: Oncology

## 2020-05-21 VITALS — BP 147/79 | HR 82 | Temp 97.2°F | Resp 16 | Wt 174.0 lb

## 2020-05-21 VITALS — BP 136/68 | HR 77 | Temp 96.0°F | Resp 18

## 2020-05-21 DIAGNOSIS — Z5111 Encounter for antineoplastic chemotherapy: Secondary | ICD-10-CM | POA: Diagnosis not present

## 2020-05-21 DIAGNOSIS — Z5112 Encounter for antineoplastic immunotherapy: Secondary | ICD-10-CM

## 2020-05-21 DIAGNOSIS — G893 Neoplasm related pain (acute) (chronic): Secondary | ICD-10-CM | POA: Diagnosis not present

## 2020-05-21 DIAGNOSIS — C3491 Malignant neoplasm of unspecified part of right bronchus or lung: Secondary | ICD-10-CM | POA: Diagnosis not present

## 2020-05-21 DIAGNOSIS — Z87891 Personal history of nicotine dependence: Secondary | ICD-10-CM | POA: Insufficient documentation

## 2020-05-21 DIAGNOSIS — Z79899 Other long term (current) drug therapy: Secondary | ICD-10-CM | POA: Insufficient documentation

## 2020-05-21 DIAGNOSIS — J910 Malignant pleural effusion: Secondary | ICD-10-CM | POA: Insufficient documentation

## 2020-05-21 DIAGNOSIS — D500 Iron deficiency anemia secondary to blood loss (chronic): Secondary | ICD-10-CM

## 2020-05-21 DIAGNOSIS — E876 Hypokalemia: Secondary | ICD-10-CM | POA: Diagnosis not present

## 2020-05-21 DIAGNOSIS — Z7952 Long term (current) use of systemic steroids: Secondary | ICD-10-CM | POA: Insufficient documentation

## 2020-05-21 DIAGNOSIS — C3431 Malignant neoplasm of lower lobe, right bronchus or lung: Secondary | ICD-10-CM | POA: Diagnosis not present

## 2020-05-21 LAB — COMPREHENSIVE METABOLIC PANEL
ALT: 13 U/L (ref 0–44)
AST: 23 U/L (ref 15–41)
Albumin: 3.5 g/dL (ref 3.5–5.0)
Alkaline Phosphatase: 66 U/L (ref 38–126)
Anion gap: 7 (ref 5–15)
BUN: 8 mg/dL (ref 8–23)
CO2: 24 mmol/L (ref 22–32)
Calcium: 9 mg/dL (ref 8.9–10.3)
Chloride: 105 mmol/L (ref 98–111)
Creatinine, Ser: 0.89 mg/dL (ref 0.61–1.24)
GFR, Estimated: >60 mL/min (ref >60)
Glucose, Bld: 111 mg/dL — ABNORMAL HIGH (ref 70–99)
Potassium: 3.7 mmol/L (ref 3.5–5.1)
Sodium: 136 mmol/L (ref 135–145)
Total Bilirubin: 0.6 mg/dL (ref 0.3–1.2)
Total Protein: 7.2 g/dL (ref 6.5–8.1)

## 2020-05-21 LAB — CBC WITH DIFFERENTIAL/PLATELET
Abs Immature Granulocytes: 0.01 10*3/uL (ref 0.00–0.07)
Basophils Absolute: 0 10*3/uL (ref 0.0–0.1)
Basophils Relative: 0 %
Eosinophils Absolute: 0 10*3/uL (ref 0.0–0.5)
Eosinophils Relative: 0 %
HCT: 32.4 % — ABNORMAL LOW (ref 39.0–52.0)
Hemoglobin: 9.7 g/dL — ABNORMAL LOW (ref 13.0–17.0)
Immature Granulocytes: 0 %
Lymphocytes Relative: 13 %
Lymphs Abs: 0.6 10*3/uL — ABNORMAL LOW (ref 0.7–4.0)
MCH: 25.6 pg — ABNORMAL LOW (ref 26.0–34.0)
MCHC: 29.9 g/dL — ABNORMAL LOW (ref 30.0–36.0)
MCV: 85.5 fL (ref 80.0–100.0)
Monocytes Absolute: 0.4 10*3/uL (ref 0.1–1.0)
Monocytes Relative: 8 %
Neutro Abs: 3.9 10*3/uL (ref 1.7–7.7)
Neutrophils Relative %: 79 %
Platelets: 186 10*3/uL (ref 150–400)
RBC: 3.79 MIL/uL — ABNORMAL LOW (ref 4.22–5.81)
RDW: 16.7 % — ABNORMAL HIGH (ref 11.5–15.5)
WBC: 4.9 10*3/uL (ref 4.0–10.5)
nRBC: 0 % (ref 0.0–0.2)

## 2020-05-21 LAB — TSH: TSH: 1.268 u[IU]/mL (ref 0.350–4.500)

## 2020-05-21 MED ORDER — SODIUM CHLORIDE 0.9 % IV SOLN
500.0000 mg/m2 | Freq: Once | INTRAVENOUS | Status: AC
  Administered 2020-05-21: 1000 mg via INTRAVENOUS
  Filled 2020-05-21: qty 40

## 2020-05-21 MED ORDER — SODIUM CHLORIDE 0.9 % IV SOLN
460.0000 mg | Freq: Once | INTRAVENOUS | Status: AC
  Administered 2020-05-21: 460 mg via INTRAVENOUS
  Filled 2020-05-21: qty 46

## 2020-05-21 MED ORDER — SODIUM CHLORIDE 0.9 % IV SOLN
150.0000 mg | Freq: Once | INTRAVENOUS | Status: AC
  Administered 2020-05-21: 150 mg via INTRAVENOUS
  Filled 2020-05-21: qty 150

## 2020-05-21 MED ORDER — SODIUM CHLORIDE 0.9 % IV SOLN
10.0000 mg | Freq: Once | INTRAVENOUS | Status: AC
  Administered 2020-05-21: 10 mg via INTRAVENOUS
  Filled 2020-05-21: qty 10

## 2020-05-21 MED ORDER — SODIUM CHLORIDE 0.9 % IV SOLN
200.0000 mg | Freq: Once | INTRAVENOUS | Status: AC
  Administered 2020-05-21: 200 mg via INTRAVENOUS
  Filled 2020-05-21: qty 8

## 2020-05-21 MED ORDER — SODIUM CHLORIDE 0.9 % IV SOLN
Freq: Once | INTRAVENOUS | Status: AC
  Filled 2020-05-21: qty 250

## 2020-05-21 MED ORDER — HEPARIN SOD (PORK) LOCK FLUSH 100 UNIT/ML IV SOLN
500.0000 [IU] | Freq: Once | INTRAVENOUS | Status: AC | PRN
  Administered 2020-05-21: 500 [IU]
  Filled 2020-05-21: qty 5

## 2020-05-21 MED ORDER — SODIUM CHLORIDE 0.9 % IV SOLN
510.0000 mg | Freq: Once | INTRAVENOUS | Status: AC
  Administered 2020-05-21: 510 mg via INTRAVENOUS
  Filled 2020-05-21: qty 510

## 2020-05-21 MED ORDER — PALONOSETRON HCL INJECTION 0.25 MG/5ML
0.2500 mg | Freq: Once | INTRAVENOUS | Status: AC
  Administered 2020-05-21: 0.25 mg via INTRAVENOUS
  Filled 2020-05-21: qty 5
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
## 2020-05-21 NOTE — Progress Notes (Signed)
Pt questioned when he could receive covid booster vaccine, per Dr. Janese Banks; 2 weeks from today's treatment, pt aware and verbalizes understanding.   Pt tolerated infusion well, no s/s of distress or reaction noted. Pt and VS stable at discharge.

## 2020-06-04 ENCOUNTER — Ambulatory Visit: Payer: Medicare HMO

## 2020-06-08 ENCOUNTER — Ambulatory Visit: Payer: Medicare HMO

## 2020-06-09 ENCOUNTER — Inpatient Hospital Stay: Payer: Medicare HMO

## 2020-06-09 ENCOUNTER — Other Ambulatory Visit: Payer: Self-pay

## 2020-06-09 ENCOUNTER — Ambulatory Visit
Admission: RE | Admit: 2020-06-09 | Discharge: 2020-06-09 | Disposition: A | Discharge status: Home / Self Care | Payer: Medicare HMO | Source: Ambulatory Visit | Attending: Oncology | Admitting: Oncology

## 2020-06-09 DIAGNOSIS — Z95828 Presence of other vascular implants and grafts: Secondary | ICD-10-CM

## 2020-06-09 DIAGNOSIS — G893 Neoplasm related pain (acute) (chronic): Secondary | ICD-10-CM | POA: Diagnosis not present

## 2020-06-09 DIAGNOSIS — Z5112 Encounter for antineoplastic immunotherapy: Secondary | ICD-10-CM | POA: Diagnosis not present

## 2020-06-09 DIAGNOSIS — C3491 Malignant neoplasm of unspecified part of right bronchus or lung: Secondary | ICD-10-CM | POA: Diagnosis not present

## 2020-06-09 DIAGNOSIS — C3431 Malignant neoplasm of lower lobe, right bronchus or lung: Secondary | ICD-10-CM | POA: Diagnosis not present

## 2020-06-09 DIAGNOSIS — Z7952 Long term (current) use of systemic steroids: Secondary | ICD-10-CM | POA: Diagnosis not present

## 2020-06-09 DIAGNOSIS — J910 Malignant pleural effusion: Secondary | ICD-10-CM | POA: Diagnosis not present

## 2020-06-09 DIAGNOSIS — Z87891 Personal history of nicotine dependence: Secondary | ICD-10-CM | POA: Diagnosis not present

## 2020-06-09 DIAGNOSIS — Z79899 Other long term (current) drug therapy: Secondary | ICD-10-CM | POA: Diagnosis not present

## 2020-06-09 DIAGNOSIS — E876 Hypokalemia: Secondary | ICD-10-CM | POA: Diagnosis not present

## 2020-06-09 DIAGNOSIS — Z5111 Encounter for antineoplastic chemotherapy: Secondary | ICD-10-CM | POA: Diagnosis not present

## 2020-06-09 MED ORDER — HEPARIN SOD (PORK) LOCK FLUSH 100 UNIT/ML IV SOLN
500.0000 [IU] | Freq: Once | INTRAVENOUS | Status: AC
  Administered 2020-06-09: 500 [IU] via INTRAVENOUS
  Filled 2020-06-09: qty 5

## 2020-06-09 MED ORDER — IOHEXOL 300 MG/ML  SOLN
100.0000 mL | Freq: Once | INTRAMUSCULAR | Status: AC | PRN
  Administered 2020-06-09: 100 mL via INTRAVENOUS

## 2020-06-09 MED ORDER — SODIUM CHLORIDE 0.9% FLUSH
10.0000 mL | INTRAVENOUS | Status: DC | PRN
  Administered 2020-06-09: 10 mL via INTRAVENOUS
  Filled 2020-06-09: qty 10

## 2020-06-09 NOTE — Progress Notes (Signed)
Port accessed for CT Scan, then de accessed after scan was completed.

## 2020-06-10 ENCOUNTER — Inpatient Hospital Stay: Payer: Medicare HMO

## 2020-06-10 ENCOUNTER — Encounter: Payer: Self-pay | Admitting: Oncology

## 2020-06-10 ENCOUNTER — Inpatient Hospital Stay (HOSPITAL_BASED_OUTPATIENT_CLINIC_OR_DEPARTMENT_OTHER): Payer: Medicare HMO | Admitting: Oncology

## 2020-06-10 ENCOUNTER — Ambulatory Visit: Payer: Medicare HMO

## 2020-06-10 ENCOUNTER — Other Ambulatory Visit: Payer: Self-pay | Admitting: *Deleted

## 2020-06-10 VITALS — BP 119/61 | HR 77 | Resp 16

## 2020-06-10 VITALS — BP 123/58 | HR 94 | Temp 98.4°F | Resp 16 | Wt 175.4 lb

## 2020-06-10 DIAGNOSIS — G893 Neoplasm related pain (acute) (chronic): Secondary | ICD-10-CM | POA: Diagnosis not present

## 2020-06-10 DIAGNOSIS — C3491 Malignant neoplasm of unspecified part of right bronchus or lung: Secondary | ICD-10-CM | POA: Diagnosis not present

## 2020-06-10 DIAGNOSIS — Z5111 Encounter for antineoplastic chemotherapy: Secondary | ICD-10-CM

## 2020-06-10 DIAGNOSIS — C3431 Malignant neoplasm of lower lobe, right bronchus or lung: Secondary | ICD-10-CM | POA: Diagnosis not present

## 2020-06-10 DIAGNOSIS — Z87891 Personal history of nicotine dependence: Secondary | ICD-10-CM | POA: Diagnosis not present

## 2020-06-10 DIAGNOSIS — J910 Malignant pleural effusion: Secondary | ICD-10-CM | POA: Diagnosis not present

## 2020-06-10 DIAGNOSIS — E876 Hypokalemia: Secondary | ICD-10-CM

## 2020-06-10 DIAGNOSIS — Z5112 Encounter for antineoplastic immunotherapy: Secondary | ICD-10-CM | POA: Diagnosis not present

## 2020-06-10 DIAGNOSIS — Z79899 Other long term (current) drug therapy: Secondary | ICD-10-CM | POA: Diagnosis not present

## 2020-06-10 DIAGNOSIS — D500 Iron deficiency anemia secondary to blood loss (chronic): Secondary | ICD-10-CM

## 2020-06-10 DIAGNOSIS — Z7952 Long term (current) use of systemic steroids: Secondary | ICD-10-CM | POA: Diagnosis not present

## 2020-06-10 LAB — CBC WITH DIFFERENTIAL/PLATELET
Abs Immature Granulocytes: 0.01 10*3/uL (ref 0.00–0.07)
Basophils Absolute: 0 10*3/uL (ref 0.0–0.1)
Basophils Relative: 0 %
Eosinophils Absolute: 0 10*3/uL (ref 0.0–0.5)
Eosinophils Relative: 0 %
HCT: 34 % — ABNORMAL LOW (ref 39.0–52.0)
Hemoglobin: 10.4 g/dL — ABNORMAL LOW (ref 13.0–17.0)
Immature Granulocytes: 0 %
Lymphocytes Relative: 28 %
Lymphs Abs: 1 10*3/uL (ref 0.7–4.0)
MCH: 27.6 pg (ref 26.0–34.0)
MCHC: 30.6 g/dL (ref 30.0–36.0)
MCV: 90.2 fL (ref 80.0–100.0)
Monocytes Absolute: 0.5 10*3/uL (ref 0.1–1.0)
Monocytes Relative: 14 %
Neutro Abs: 2.1 10*3/uL (ref 1.7–7.7)
Neutrophils Relative %: 58 %
Platelets: 231 10*3/uL (ref 150–400)
RBC: 3.77 MIL/uL — ABNORMAL LOW (ref 4.22–5.81)
RDW: 18.8 % — ABNORMAL HIGH (ref 11.5–15.5)
WBC: 3.6 10*3/uL — ABNORMAL LOW (ref 4.0–10.5)
nRBC: 0 % (ref 0.0–0.2)

## 2020-06-10 LAB — COMPREHENSIVE METABOLIC PANEL
ALT: 27 U/L (ref 0–44)
AST: 39 U/L (ref 15–41)
Albumin: 3.6 g/dL (ref 3.5–5.0)
Alkaline Phosphatase: 49 U/L (ref 38–126)
Anion gap: 10 (ref 5–15)
BUN: 8 mg/dL (ref 8–23)
CO2: 22 mmol/L (ref 22–32)
Calcium: 8.6 mg/dL — ABNORMAL LOW (ref 8.9–10.3)
Chloride: 104 mmol/L (ref 98–111)
Creatinine, Ser: 0.93 mg/dL (ref 0.61–1.24)
GFR, Estimated: >60 mL/min (ref >60)
Glucose, Bld: 123 mg/dL — ABNORMAL HIGH (ref 70–99)
Potassium: 3.1 mmol/L — ABNORMAL LOW (ref 3.5–5.1)
Sodium: 136 mmol/L (ref 135–145)
Total Bilirubin: 0.7 mg/dL (ref 0.3–1.2)
Total Protein: 6.9 g/dL (ref 6.5–8.1)

## 2020-06-10 MED ORDER — HEPARIN SOD (PORK) LOCK FLUSH 100 UNIT/ML IV SOLN
500.0000 [IU] | Freq: Once | INTRAVENOUS | Status: AC
  Administered 2020-06-10: 500 [IU] via INTRAVENOUS
  Filled 2020-06-10: qty 5

## 2020-06-10 MED ORDER — SODIUM CHLORIDE 0.9 % IV SOLN
Freq: Once | INTRAVENOUS | Status: AC
  Filled 2020-06-10: qty 250

## 2020-06-10 MED ORDER — HEPARIN SOD (PORK) LOCK FLUSH 100 UNIT/ML IV SOLN
INTRAVENOUS | Status: AC
  Filled 2020-06-10: qty 5

## 2020-06-10 MED ORDER — POTASSIUM CHLORIDE CRYS ER 20 MEQ PO TBCR: 20.0000 meq | EXTENDED_RELEASE_TABLET | Freq: Every day | ORAL | 0 refills | Status: DC

## 2020-06-10 MED ORDER — SODIUM CHLORIDE 0.9 % IV SOLN
500.0000 mg/m2 | Freq: Once | INTRAVENOUS | Status: AC
  Administered 2020-06-10: 1000 mg via INTRAVENOUS
  Filled 2020-06-10: qty 40

## 2020-06-10 MED ORDER — SODIUM CHLORIDE 0.9 % IV SOLN
510.0000 mg | Freq: Once | INTRAVENOUS | Status: AC
  Administered 2020-06-10: 510 mg via INTRAVENOUS
  Filled 2020-06-10: qty 510

## 2020-06-10 MED ORDER — SODIUM CHLORIDE 0.9 % IV SOLN
200.0000 mg | Freq: Once | INTRAVENOUS | Status: AC
  Administered 2020-06-10: 200 mg via INTRAVENOUS
  Filled 2020-06-10: qty 8

## 2020-06-10 MED ORDER — SODIUM CHLORIDE 0.9% FLUSH
10.0000 mL | Freq: Once | INTRAVENOUS | Status: AC
  Administered 2020-06-10: 10 mL via INTRAVENOUS
  Filled 2020-06-10: qty 10

## 2020-06-10 MED ORDER — PROCHLORPERAZINE MALEATE 10 MG PO TABS
10.0000 mg | ORAL_TABLET | Freq: Once | ORAL | Status: AC
  Administered 2020-06-10: 10 mg via ORAL
  Filled 2020-06-10: qty 1

## 2020-06-10 NOTE — Progress Notes (Signed)
Patient tolerated infusion well. Patient and VSS. Discharged home  
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
## 2020-07-01 ENCOUNTER — Encounter: Payer: Self-pay | Admitting: Oncology

## 2020-07-01 ENCOUNTER — Other Ambulatory Visit: Payer: Self-pay | Admitting: Oncology

## 2020-07-01 ENCOUNTER — Ambulatory Visit: Payer: Medicare HMO

## 2020-07-01 ENCOUNTER — Inpatient Hospital Stay: Payer: Medicare HMO

## 2020-07-01 ENCOUNTER — Inpatient Hospital Stay: Payer: Medicare HMO | Attending: Oncology

## 2020-07-01 ENCOUNTER — Inpatient Hospital Stay (HOSPITAL_BASED_OUTPATIENT_CLINIC_OR_DEPARTMENT_OTHER): Payer: Medicare HMO | Admitting: Oncology

## 2020-07-01 VITALS — BP 169/79 | HR 87 | Resp 20 | Wt 173.0 lb

## 2020-07-01 DIAGNOSIS — D509 Iron deficiency anemia, unspecified: Secondary | ICD-10-CM | POA: Diagnosis not present

## 2020-07-01 DIAGNOSIS — C3491 Malignant neoplasm of unspecified part of right bronchus or lung: Secondary | ICD-10-CM | POA: Diagnosis not present

## 2020-07-01 DIAGNOSIS — I10 Essential (primary) hypertension: Secondary | ICD-10-CM | POA: Diagnosis not present

## 2020-07-01 DIAGNOSIS — C782 Secondary malignant neoplasm of pleura: Secondary | ICD-10-CM | POA: Insufficient documentation

## 2020-07-01 DIAGNOSIS — C3490 Malignant neoplasm of unspecified part of unspecified bronchus or lung: Secondary | ICD-10-CM | POA: Diagnosis not present

## 2020-07-01 DIAGNOSIS — E785 Hyperlipidemia, unspecified: Secondary | ICD-10-CM | POA: Diagnosis not present

## 2020-07-01 DIAGNOSIS — Z5112 Encounter for antineoplastic immunotherapy: Secondary | ICD-10-CM | POA: Diagnosis not present

## 2020-07-01 DIAGNOSIS — Z87891 Personal history of nicotine dependence: Secondary | ICD-10-CM | POA: Diagnosis not present

## 2020-07-01 DIAGNOSIS — Z5111 Encounter for antineoplastic chemotherapy: Secondary | ICD-10-CM | POA: Diagnosis not present

## 2020-07-01 DIAGNOSIS — J910 Malignant pleural effusion: Secondary | ICD-10-CM | POA: Diagnosis not present

## 2020-07-01 DIAGNOSIS — Z79899 Other long term (current) drug therapy: Secondary | ICD-10-CM | POA: Diagnosis not present

## 2020-07-01 LAB — COMPREHENSIVE METABOLIC PANEL
ALT: 21 U/L (ref 0–44)
AST: 33 U/L (ref 15–41)
Albumin: 3.8 g/dL (ref 3.5–5.0)
Alkaline Phosphatase: 56 U/L (ref 38–126)
Anion gap: 8 (ref 5–15)
BUN: 6 mg/dL — ABNORMAL LOW (ref 8–23)
CO2: 25 mmol/L (ref 22–32)
Calcium: 9.6 mg/dL (ref 8.9–10.3)
Chloride: 104 mmol/L (ref 98–111)
Creatinine, Ser: 0.88 mg/dL (ref 0.61–1.24)
GFR, Estimated: >60 mL/min (ref >60)
Glucose, Bld: 141 mg/dL — ABNORMAL HIGH (ref 70–99)
Potassium: 3.7 mmol/L (ref 3.5–5.1)
Sodium: 137 mmol/L (ref 135–145)
Total Bilirubin: 0.5 mg/dL (ref 0.3–1.2)
Total Protein: 7.6 g/dL (ref 6.5–8.1)

## 2020-07-01 LAB — CBC WITH DIFFERENTIAL/PLATELET
Abs Immature Granulocytes: 0.03 10*3/uL (ref 0.00–0.07)
Basophils Absolute: 0 10*3/uL (ref 0.0–0.1)
Basophils Relative: 0 %
Eosinophils Absolute: 0 10*3/uL (ref 0.0–0.5)
Eosinophils Relative: 0 %
HCT: 37 % — ABNORMAL LOW (ref 39.0–52.0)
Hemoglobin: 11.6 g/dL — ABNORMAL LOW (ref 13.0–17.0)
Immature Granulocytes: 0 %
Lymphocytes Relative: 9 %
Lymphs Abs: 0.7 10*3/uL (ref 0.7–4.0)
MCH: 28.7 pg (ref 26.0–34.0)
MCHC: 31.4 g/dL (ref 30.0–36.0)
MCV: 91.6 fL (ref 80.0–100.0)
Monocytes Absolute: 0.4 10*3/uL (ref 0.1–1.0)
Monocytes Relative: 5 %
Neutro Abs: 6.7 10*3/uL (ref 1.7–7.7)
Neutrophils Relative %: 86 %
Platelets: 199 10*3/uL (ref 150–400)
RBC: 4.04 MIL/uL — ABNORMAL LOW (ref 4.22–5.81)
RDW: 18 % — ABNORMAL HIGH (ref 11.5–15.5)
WBC: 7.9 10*3/uL (ref 4.0–10.5)
nRBC: 0 % (ref 0.0–0.2)

## 2020-07-01 MED ORDER — CYANOCOBALAMIN 1000 MCG/ML IJ SOLN
1000.0000 ug | Freq: Once | INTRAMUSCULAR | Status: AC
  Administered 2020-07-01: 1000 ug via INTRAMUSCULAR
  Filled 2020-07-01: qty 1

## 2020-07-01 MED ORDER — SODIUM CHLORIDE 0.9% FLUSH
10.0000 mL | INTRAVENOUS | Status: DC | PRN
  Administered 2020-07-01: 10 mL via INTRAVENOUS
  Filled 2020-07-01: qty 10

## 2020-07-01 MED ORDER — PROCHLORPERAZINE MALEATE 10 MG PO TABS
10.0000 mg | ORAL_TABLET | Freq: Once | ORAL | Status: AC
  Administered 2020-07-01: 10 mg via ORAL
  Filled 2020-07-01: qty 1

## 2020-07-01 MED ORDER — SODIUM CHLORIDE 0.9 % IV SOLN
500.0000 mg/m2 | Freq: Once | INTRAVENOUS | Status: AC
  Administered 2020-07-01: 1000 mg via INTRAVENOUS
  Filled 2020-07-01: qty 40

## 2020-07-01 MED ORDER — SODIUM CHLORIDE 0.9 % IV SOLN
200.0000 mg | Freq: Once | INTRAVENOUS | Status: AC
  Administered 2020-07-01: 200 mg via INTRAVENOUS
  Filled 2020-07-01: qty 8

## 2020-07-01 MED ORDER — HEPARIN SOD (PORK) LOCK FLUSH 100 UNIT/ML IV SOLN
INTRAVENOUS | Status: AC
  Filled 2020-07-01: qty 5

## 2020-07-01 MED ORDER — HEPARIN SOD (PORK) LOCK FLUSH 100 UNIT/ML IV SOLN
500.0000 [IU] | Freq: Once | INTRAVENOUS | Status: AC
  Administered 2020-07-01: 500 [IU] via INTRAVENOUS
  Filled 2020-07-01: qty 5

## 2020-07-01 MED ORDER — SODIUM CHLORIDE 0.9 % IV SOLN
Freq: Once | INTRAVENOUS | Status: AC
  Filled 2020-07-01: qty 250

## 2020-07-05 ENCOUNTER — Encounter: Payer: Self-pay | Admitting: Oncology
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
## 2020-07-20 ENCOUNTER — Telehealth: Payer: Self-pay | Admitting: Oncology

## 2020-07-20 NOTE — Telephone Encounter (Signed)
Returned call to pt's son to assist with setting up MyChart account.

## 2020-07-22 ENCOUNTER — Inpatient Hospital Stay: Payer: Medicare HMO

## 2020-07-22 ENCOUNTER — Inpatient Hospital Stay (HOSPITAL_BASED_OUTPATIENT_CLINIC_OR_DEPARTMENT_OTHER): Payer: Medicare HMO | Admitting: Oncology

## 2020-07-22 ENCOUNTER — Encounter: Payer: Self-pay | Admitting: Oncology

## 2020-07-22 ENCOUNTER — Inpatient Hospital Stay: Payer: Medicare HMO | Attending: Oncology

## 2020-07-22 VITALS — BP 122/77 | HR 98 | Temp 96.9°F | Wt 172.7 lb

## 2020-07-22 VITALS — BP 133/64 | HR 73 | Resp 16

## 2020-07-22 DIAGNOSIS — C3431 Malignant neoplasm of lower lobe, right bronchus or lung: Secondary | ICD-10-CM | POA: Insufficient documentation

## 2020-07-22 DIAGNOSIS — Z5111 Encounter for antineoplastic chemotherapy: Secondary | ICD-10-CM | POA: Diagnosis not present

## 2020-07-22 DIAGNOSIS — Z7952 Long term (current) use of systemic steroids: Secondary | ICD-10-CM | POA: Insufficient documentation

## 2020-07-22 DIAGNOSIS — R7989 Other specified abnormal findings of blood chemistry: Secondary | ICD-10-CM | POA: Diagnosis not present

## 2020-07-22 DIAGNOSIS — D500 Iron deficiency anemia secondary to blood loss (chronic): Secondary | ICD-10-CM | POA: Diagnosis not present

## 2020-07-22 DIAGNOSIS — N179 Acute kidney failure, unspecified: Secondary | ICD-10-CM | POA: Insufficient documentation

## 2020-07-22 DIAGNOSIS — C782 Secondary malignant neoplasm of pleura: Secondary | ICD-10-CM | POA: Insufficient documentation

## 2020-07-22 DIAGNOSIS — E785 Hyperlipidemia, unspecified: Secondary | ICD-10-CM | POA: Diagnosis not present

## 2020-07-22 DIAGNOSIS — I10 Essential (primary) hypertension: Secondary | ICD-10-CM | POA: Insufficient documentation

## 2020-07-22 DIAGNOSIS — Z5112 Encounter for antineoplastic immunotherapy: Secondary | ICD-10-CM | POA: Insufficient documentation

## 2020-07-22 DIAGNOSIS — Z79899 Other long term (current) drug therapy: Secondary | ICD-10-CM | POA: Insufficient documentation

## 2020-07-22 DIAGNOSIS — G893 Neoplasm related pain (acute) (chronic): Secondary | ICD-10-CM | POA: Insufficient documentation

## 2020-07-22 DIAGNOSIS — Z87891 Personal history of nicotine dependence: Secondary | ICD-10-CM | POA: Diagnosis not present

## 2020-07-22 DIAGNOSIS — R5381 Other malaise: Secondary | ICD-10-CM | POA: Insufficient documentation

## 2020-07-22 DIAGNOSIS — Z95828 Presence of other vascular implants and grafts: Secondary | ICD-10-CM

## 2020-07-22 DIAGNOSIS — C3491 Malignant neoplasm of unspecified part of right bronchus or lung: Secondary | ICD-10-CM

## 2020-07-22 DIAGNOSIS — R5383 Other fatigue: Secondary | ICD-10-CM | POA: Diagnosis not present

## 2020-07-22 DIAGNOSIS — D509 Iron deficiency anemia, unspecified: Secondary | ICD-10-CM | POA: Diagnosis not present

## 2020-07-22 DIAGNOSIS — J910 Malignant pleural effusion: Secondary | ICD-10-CM | POA: Diagnosis not present

## 2020-07-22 LAB — COMPREHENSIVE METABOLIC PANEL
ALT: 14 U/L (ref 0–44)
AST: 24 U/L (ref 15–41)
Albumin: 3.7 g/dL (ref 3.5–5.0)
Alkaline Phosphatase: 59 U/L (ref 38–126)
Anion gap: 11 (ref 5–15)
BUN: 14 mg/dL (ref 8–23)
CO2: 23 mmol/L (ref 22–32)
Calcium: 9.3 mg/dL (ref 8.9–10.3)
Chloride: 104 mmol/L (ref 98–111)
Creatinine, Ser: 1.08 mg/dL (ref 0.61–1.24)
GFR, Estimated: >60 mL/min (ref >60)
Glucose, Bld: 153 mg/dL — ABNORMAL HIGH (ref 70–99)
Potassium: 3.6 mmol/L (ref 3.5–5.1)
Sodium: 138 mmol/L (ref 135–145)
Total Bilirubin: 0.7 mg/dL (ref 0.3–1.2)
Total Protein: 8.4 g/dL — ABNORMAL HIGH (ref 6.5–8.1)

## 2020-07-22 LAB — CBC WITH DIFFERENTIAL/PLATELET
Abs Immature Granulocytes: 0.04 10*3/uL (ref 0.00–0.07)
Basophils Absolute: 0 10*3/uL (ref 0.0–0.1)
Basophils Relative: 0 %
Eosinophils Absolute: 0 10*3/uL (ref 0.0–0.5)
Eosinophils Relative: 0 %
HCT: 40.6 % (ref 39.0–52.0)
Hemoglobin: 13 g/dL (ref 13.0–17.0)
Immature Granulocytes: 0 %
Lymphocytes Relative: 6 %
Lymphs Abs: 0.8 10*3/uL (ref 0.7–4.0)
MCH: 29.3 pg (ref 26.0–34.0)
MCHC: 32 g/dL (ref 30.0–36.0)
MCV: 91.4 fL (ref 80.0–100.0)
Monocytes Absolute: 0.7 10*3/uL (ref 0.1–1.0)
Monocytes Relative: 5 %
Neutro Abs: 12.1 10*3/uL — ABNORMAL HIGH (ref 1.7–7.7)
Neutrophils Relative %: 89 %
Platelets: 206 10*3/uL (ref 150–400)
RBC: 4.44 MIL/uL (ref 4.22–5.81)
RDW: 14.6 % (ref 11.5–15.5)
WBC: 13.6 10*3/uL — ABNORMAL HIGH (ref 4.0–10.5)
nRBC: 0 % (ref 0.0–0.2)

## 2020-07-22 MED ORDER — PEMETREXED DISODIUM CHEMO INJECTION 500 MG
500.0000 mg/m2 | Freq: Once | INTRAVENOUS | Status: AC
  Administered 2020-07-22: 1000 mg via INTRAVENOUS
  Filled 2020-07-22: qty 40

## 2020-07-22 MED ORDER — HEPARIN SOD (PORK) LOCK FLUSH 100 UNIT/ML IV SOLN
INTRAVENOUS | Status: AC
  Filled 2020-07-22: qty 5

## 2020-07-22 MED ORDER — PROCHLORPERAZINE MALEATE 10 MG PO TABS
10.0000 mg | ORAL_TABLET | Freq: Once | ORAL | Status: AC
  Administered 2020-07-22: 10 mg via ORAL
  Filled 2020-07-22: qty 1

## 2020-07-22 MED ORDER — SODIUM CHLORIDE 0.9 % IV SOLN
200.0000 mg | Freq: Once | INTRAVENOUS | Status: AC
  Administered 2020-07-22: 200 mg via INTRAVENOUS
  Filled 2020-07-22: qty 8

## 2020-07-22 MED ORDER — SODIUM CHLORIDE 0.9% FLUSH
10.0000 mL | Freq: Once | INTRAVENOUS | Status: AC
  Administered 2020-07-22: 10 mL via INTRAVENOUS
  Filled 2020-07-22: qty 10

## 2020-07-22 MED ORDER — SODIUM CHLORIDE 0.9 % IV SOLN
Freq: Once | INTRAVENOUS | Status: AC
  Filled 2020-07-22: qty 250

## 2020-07-22 MED ORDER — HEPARIN SOD (PORK) LOCK FLUSH 100 UNIT/ML IV SOLN
500.0000 [IU] | Freq: Once | INTRAVENOUS | Status: AC
  Administered 2020-07-22: 500 [IU] via INTRAVENOUS
  Filled 2020-07-22: qty 5

## 2020-07-22 NOTE — Addendum Note (Signed)
Addended by: Kern Alberta on: 07/22/2020 01:42 PM   Modules accepted: Orders
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
## 2020-07-22 NOTE — Progress Notes (Signed)
Patient tolerated treatment well. Patient and VSS. Discharged home

## 2020-08-12 ENCOUNTER — Inpatient Hospital Stay: Payer: Medicare HMO

## 2020-08-12 ENCOUNTER — Inpatient Hospital Stay (HOSPITAL_BASED_OUTPATIENT_CLINIC_OR_DEPARTMENT_OTHER): Payer: Medicare HMO | Admitting: Oncology

## 2020-08-12 ENCOUNTER — Encounter: Payer: Self-pay | Admitting: Oncology

## 2020-08-12 VITALS — BP 132/71 | HR 90 | Temp 97.3°F | Resp 20 | Wt 166.7 lb

## 2020-08-12 DIAGNOSIS — C3491 Malignant neoplasm of unspecified part of right bronchus or lung: Secondary | ICD-10-CM

## 2020-08-12 DIAGNOSIS — Z5112 Encounter for antineoplastic immunotherapy: Secondary | ICD-10-CM

## 2020-08-12 DIAGNOSIS — D500 Iron deficiency anemia secondary to blood loss (chronic): Secondary | ICD-10-CM

## 2020-08-12 DIAGNOSIS — D509 Iron deficiency anemia, unspecified: Secondary | ICD-10-CM | POA: Diagnosis not present

## 2020-08-12 DIAGNOSIS — Z5111 Encounter for antineoplastic chemotherapy: Secondary | ICD-10-CM

## 2020-08-12 DIAGNOSIS — R7989 Other specified abnormal findings of blood chemistry: Secondary | ICD-10-CM | POA: Diagnosis not present

## 2020-08-12 DIAGNOSIS — J910 Malignant pleural effusion: Secondary | ICD-10-CM | POA: Diagnosis not present

## 2020-08-12 DIAGNOSIS — Z79899 Other long term (current) drug therapy: Secondary | ICD-10-CM | POA: Diagnosis not present

## 2020-08-12 DIAGNOSIS — C3431 Malignant neoplasm of lower lobe, right bronchus or lung: Secondary | ICD-10-CM | POA: Diagnosis not present

## 2020-08-12 DIAGNOSIS — G893 Neoplasm related pain (acute) (chronic): Secondary | ICD-10-CM

## 2020-08-12 DIAGNOSIS — E876 Hypokalemia: Secondary | ICD-10-CM | POA: Diagnosis not present

## 2020-08-12 DIAGNOSIS — C782 Secondary malignant neoplasm of pleura: Secondary | ICD-10-CM | POA: Diagnosis not present

## 2020-08-12 DIAGNOSIS — N179 Acute kidney failure, unspecified: Secondary | ICD-10-CM

## 2020-08-12 LAB — CBC WITH DIFFERENTIAL/PLATELET
Abs Immature Granulocytes: 0.07 10*3/uL (ref 0.00–0.07)
Basophils Absolute: 0 10*3/uL (ref 0.0–0.1)
Basophils Relative: 0 %
Eosinophils Absolute: 0 10*3/uL (ref 0.0–0.5)
Eosinophils Relative: 0 %
HCT: 40.8 % (ref 39.0–52.0)
Hemoglobin: 12.8 g/dL — ABNORMAL LOW (ref 13.0–17.0)
Immature Granulocytes: 1 %
Lymphocytes Relative: 7 %
Lymphs Abs: 0.7 10*3/uL (ref 0.7–4.0)
MCH: 29 pg (ref 26.0–34.0)
MCHC: 31.4 g/dL (ref 30.0–36.0)
MCV: 92.3 fL (ref 80.0–100.0)
Monocytes Absolute: 0.4 10*3/uL (ref 0.1–1.0)
Monocytes Relative: 4 %
Neutro Abs: 9.9 10*3/uL — ABNORMAL HIGH (ref 1.7–7.7)
Neutrophils Relative %: 88 %
Platelets: 335 10*3/uL (ref 150–400)
RBC: 4.42 MIL/uL (ref 4.22–5.81)
RDW: 14.3 % (ref 11.5–15.5)
WBC: 11.2 10*3/uL — ABNORMAL HIGH (ref 4.0–10.5)
nRBC: 0 % (ref 0.0–0.2)

## 2020-08-12 LAB — COMPREHENSIVE METABOLIC PANEL
ALT: 29 U/L (ref 0–44)
AST: 32 U/L (ref 15–41)
Albumin: 3.8 g/dL (ref 3.5–5.0)
Alkaline Phosphatase: 67 U/L (ref 38–126)
Anion gap: 13 (ref 5–15)
BUN: 22 mg/dL (ref 8–23)
CO2: 25 mmol/L (ref 22–32)
Calcium: 9.5 mg/dL (ref 8.9–10.3)
Chloride: 100 mmol/L (ref 98–111)
Creatinine, Ser: 1.27 mg/dL — ABNORMAL HIGH (ref 0.61–1.24)
GFR, Estimated: 59 mL/min — ABNORMAL LOW (ref >60)
Glucose, Bld: 139 mg/dL — ABNORMAL HIGH (ref 70–99)
Potassium: 3.2 mmol/L — ABNORMAL LOW (ref 3.5–5.1)
Sodium: 138 mmol/L (ref 135–145)
Total Bilirubin: 0.6 mg/dL (ref 0.3–1.2)
Total Protein: 8.1 g/dL (ref 6.5–8.1)

## 2020-08-12 LAB — TSH: TSH: 1.467 u[IU]/mL (ref 0.350–4.500)

## 2020-08-12 MED ORDER — SODIUM CHLORIDE 0.9 % IV SOLN
500.0000 mg/m2 | Freq: Once | INTRAVENOUS | Status: AC
  Administered 2020-08-12: 1000 mg via INTRAVENOUS
  Filled 2020-08-12: qty 40

## 2020-08-12 MED ORDER — POTASSIUM CHLORIDE IN NACL 20-0.9 MEQ/L-% IV SOLN
Freq: Once | INTRAVENOUS | Status: AC
  Filled 2020-08-12: qty 1000

## 2020-08-12 MED ORDER — HEPARIN SOD (PORK) LOCK FLUSH 100 UNIT/ML IV SOLN
500.0000 [IU] | Freq: Once | INTRAVENOUS | Status: AC
  Administered 2020-08-12: 500 [IU] via INTRAVENOUS
  Filled 2020-08-12: qty 5

## 2020-08-12 MED ORDER — PROCHLORPERAZINE MALEATE 10 MG PO TABS
10.0000 mg | ORAL_TABLET | Freq: Once | ORAL | Status: AC
  Administered 2020-08-12: 10 mg via ORAL
  Filled 2020-08-12: qty 1

## 2020-08-12 MED ORDER — SODIUM CHLORIDE 0.9 % IV SOLN
200.0000 mg | Freq: Once | INTRAVENOUS | Status: AC
  Administered 2020-08-12: 200 mg via INTRAVENOUS
  Filled 2020-08-12: qty 8

## 2020-08-12 MED ORDER — HEPARIN SOD (PORK) LOCK FLUSH 100 UNIT/ML IV SOLN
INTRAVENOUS | Status: AC
  Filled 2020-08-12: qty 5

## 2020-08-12 MED ORDER — SODIUM CHLORIDE 0.9 % IV SOLN
Freq: Once | INTRAVENOUS | Status: AC
  Filled 2020-08-12: qty 250

## 2020-08-12 MED ORDER — SODIUM CHLORIDE 0.9% FLUSH
10.0000 mL | Freq: Once | INTRAVENOUS | Status: AC
  Administered 2020-08-12: 10 mL via INTRAVENOUS
  Filled 2020-08-12: qty 10

## 2020-08-12 MED ORDER — CYANOCOBALAMIN 1000 MCG/ML IJ SOLN
1000.0000 ug | Freq: Once | INTRAMUSCULAR | Status: AC
  Administered 2020-08-12: 1000 ug via INTRAMUSCULAR
  Filled 2020-08-12: qty 1

## 2020-08-12 MED ORDER — HEPARIN SOD (PORK) LOCK FLUSH 100 UNIT/ML IV SOLN
500.0000 [IU] | Freq: Once | INTRAVENOUS | Status: DC | PRN
  Filled 2020-08-12: qty 5
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
## 2020-08-12 NOTE — Progress Notes (Signed)
Pt received IV alimta and keytruda infusions in clinic today. Pt also received b12 injection and 1L of IVF with K. Tolerated well. No complaints at d/c.

## 2020-08-16 ENCOUNTER — Other Ambulatory Visit: Payer: Self-pay | Admitting: Oncology

## 2020-08-16 MED ORDER — MORPHINE SULFATE 15 MG PO TABS: 15.0000 mg | ORAL_TABLET | Freq: Four times a day (QID) | ORAL | 0 refills | Status: DC | PRN

## 2020-08-26 DIAGNOSIS — C3491 Malignant neoplasm of unspecified part of right bronchus or lung: Secondary | ICD-10-CM | POA: Diagnosis not present

## 2020-08-26 DIAGNOSIS — M62838 Other muscle spasm: Secondary | ICD-10-CM | POA: Diagnosis not present

## 2020-08-26 DIAGNOSIS — I10 Essential (primary) hypertension: Secondary | ICD-10-CM | POA: Diagnosis not present

## 2020-08-26 DIAGNOSIS — I2510 Atherosclerotic heart disease of native coronary artery without angina pectoris: Secondary | ICD-10-CM | POA: Diagnosis not present

## 2020-08-26 DIAGNOSIS — J3489 Other specified disorders of nose and nasal sinuses: Secondary | ICD-10-CM | POA: Diagnosis not present

## 2020-08-26 DIAGNOSIS — R34 Anuria and oliguria: Secondary | ICD-10-CM | POA: Diagnosis not present

## 2020-08-26 DIAGNOSIS — M545 Low back pain, unspecified: Secondary | ICD-10-CM | POA: Diagnosis not present

## 2020-08-26 DIAGNOSIS — R1031 Right lower quadrant pain: Secondary | ICD-10-CM | POA: Diagnosis not present

## 2020-08-26 DIAGNOSIS — Z20822 Contact with and (suspected) exposure to covid-19: Secondary | ICD-10-CM | POA: Diagnosis not present

## 2020-09-02 ENCOUNTER — Inpatient Hospital Stay: Payer: Medicare HMO | Attending: Oncology

## 2020-09-02 ENCOUNTER — Inpatient Hospital Stay: Payer: Medicare HMO

## 2020-09-02 ENCOUNTER — Encounter: Payer: Self-pay | Admitting: Oncology

## 2020-09-02 ENCOUNTER — Other Ambulatory Visit: Payer: Self-pay | Admitting: *Deleted

## 2020-09-02 ENCOUNTER — Inpatient Hospital Stay (HOSPITAL_BASED_OUTPATIENT_CLINIC_OR_DEPARTMENT_OTHER): Payer: Medicare HMO | Admitting: Oncology

## 2020-09-02 VITALS — HR 103

## 2020-09-02 VITALS — BP 123/91 | HR 115 | Temp 95.1°F | Resp 20 | Wt 158.4 lb

## 2020-09-02 DIAGNOSIS — Z5111 Encounter for antineoplastic chemotherapy: Secondary | ICD-10-CM | POA: Insufficient documentation

## 2020-09-02 DIAGNOSIS — C3491 Malignant neoplasm of unspecified part of right bronchus or lung: Secondary | ICD-10-CM

## 2020-09-02 DIAGNOSIS — Z87891 Personal history of nicotine dependence: Secondary | ICD-10-CM | POA: Diagnosis not present

## 2020-09-02 DIAGNOSIS — C782 Secondary malignant neoplasm of pleura: Secondary | ICD-10-CM | POA: Diagnosis not present

## 2020-09-02 DIAGNOSIS — Z5112 Encounter for antineoplastic immunotherapy: Secondary | ICD-10-CM

## 2020-09-02 DIAGNOSIS — I10 Essential (primary) hypertension: Secondary | ICD-10-CM | POA: Insufficient documentation

## 2020-09-02 DIAGNOSIS — E785 Hyperlipidemia, unspecified: Secondary | ICD-10-CM | POA: Diagnosis not present

## 2020-09-02 DIAGNOSIS — Z79899 Other long term (current) drug therapy: Secondary | ICD-10-CM | POA: Insufficient documentation

## 2020-09-02 DIAGNOSIS — Z7952 Long term (current) use of systemic steroids: Secondary | ICD-10-CM | POA: Insufficient documentation

## 2020-09-02 DIAGNOSIS — Z8249 Family history of ischemic heart disease and other diseases of the circulatory system: Secondary | ICD-10-CM | POA: Diagnosis not present

## 2020-09-02 DIAGNOSIS — C3431 Malignant neoplasm of lower lobe, right bronchus or lung: Secondary | ICD-10-CM | POA: Diagnosis not present

## 2020-09-02 LAB — COMPREHENSIVE METABOLIC PANEL
ALT: 24 U/L (ref 0–44)
AST: 30 U/L (ref 15–41)
Albumin: 3.7 g/dL (ref 3.5–5.0)
Alkaline Phosphatase: 56 U/L (ref 38–126)
Anion gap: 10 (ref 5–15)
BUN: 5 mg/dL — ABNORMAL LOW (ref 8–23)
CO2: 27 mmol/L (ref 22–32)
Calcium: 9.2 mg/dL (ref 8.9–10.3)
Chloride: 98 mmol/L (ref 98–111)
Creatinine, Ser: 0.93 mg/dL (ref 0.61–1.24)
GFR, Estimated: >60 mL/min (ref >60)
Glucose, Bld: 120 mg/dL — ABNORMAL HIGH (ref 70–99)
Potassium: 3.3 mmol/L — ABNORMAL LOW (ref 3.5–5.1)
Sodium: 135 mmol/L (ref 135–145)
Total Bilirubin: 0.6 mg/dL (ref 0.3–1.2)
Total Protein: 8.1 g/dL (ref 6.5–8.1)

## 2020-09-02 LAB — CBC WITH DIFFERENTIAL/PLATELET
Abs Immature Granulocytes: 0.1 10*3/uL — ABNORMAL HIGH (ref 0.00–0.07)
Basophils Absolute: 0 10*3/uL (ref 0.0–0.1)
Basophils Relative: 0 %
Eosinophils Absolute: 0.1 10*3/uL (ref 0.0–0.5)
Eosinophils Relative: 1 %
HCT: 40.7 % (ref 39.0–52.0)
Hemoglobin: 12.8 g/dL — ABNORMAL LOW (ref 13.0–17.0)
Immature Granulocytes: 1 %
Lymphocytes Relative: 9 %
Lymphs Abs: 1.1 10*3/uL (ref 0.7–4.0)
MCH: 28.8 pg (ref 26.0–34.0)
MCHC: 31.4 g/dL (ref 30.0–36.0)
MCV: 91.5 fL (ref 80.0–100.0)
Monocytes Absolute: 0.9 10*3/uL (ref 0.1–1.0)
Monocytes Relative: 8 %
Neutro Abs: 9.4 10*3/uL — ABNORMAL HIGH (ref 1.7–7.7)
Neutrophils Relative %: 81 %
Platelets: 321 10*3/uL (ref 150–400)
RBC: 4.45 MIL/uL (ref 4.22–5.81)
RDW: 14.4 % (ref 11.5–15.5)
WBC: 11.5 10*3/uL — ABNORMAL HIGH (ref 4.0–10.5)
nRBC: 0 % (ref 0.0–0.2)

## 2020-09-02 MED ORDER — SODIUM CHLORIDE 0.9% FLUSH
10.0000 mL | INTRAVENOUS | Status: DC | PRN
  Filled 2020-09-02: qty 10

## 2020-09-02 MED ORDER — HEPARIN SOD (PORK) LOCK FLUSH 100 UNIT/ML IV SOLN
500.0000 [IU] | Freq: Once | INTRAVENOUS | Status: DC | PRN
  Filled 2020-09-02: qty 5

## 2020-09-02 MED ORDER — SODIUM CHLORIDE 0.9 % IV SOLN
Freq: Once | INTRAVENOUS | Status: AC
  Filled 2020-09-02: qty 250

## 2020-09-02 MED ORDER — SODIUM CHLORIDE 0.9 % IV SOLN
500.0000 mg/m2 | Freq: Once | INTRAVENOUS | Status: AC
  Administered 2020-09-02: 1000 mg via INTRAVENOUS
  Filled 2020-09-02: qty 40

## 2020-09-02 MED ORDER — PREGABALIN 75 MG PO CAPS
75.0000 mg | ORAL_CAPSULE | Freq: Two times a day (BID) | ORAL | 1 refills | Status: DC
Start: 2020-09-02 — End: 2020-11-01

## 2020-09-02 MED ORDER — HEPARIN SOD (PORK) LOCK FLUSH 100 UNIT/ML IV SOLN
500.0000 [IU] | Freq: Once | INTRAVENOUS | Status: AC
  Administered 2020-09-02: 500 [IU] via INTRAVENOUS
  Filled 2020-09-02: qty 5

## 2020-09-02 MED ORDER — SODIUM CHLORIDE 0.9% FLUSH
10.0000 mL | Freq: Once | INTRAVENOUS | Status: AC
  Administered 2020-09-02: 10 mL via INTRAVENOUS
  Filled 2020-09-02: qty 10

## 2020-09-02 MED ORDER — PROCHLORPERAZINE MALEATE 10 MG PO TABS
10.0000 mg | ORAL_TABLET | Freq: Once | ORAL | Status: AC
  Administered 2020-09-02: 10 mg via ORAL
  Filled 2020-09-02: qty 1

## 2020-09-02 MED ORDER — POTASSIUM CHLORIDE CRYS ER 20 MEQ PO TBCR: 20.0000 meq | EXTENDED_RELEASE_TABLET | Freq: Every day | ORAL | 0 refills | Status: DC

## 2020-09-02 MED ORDER — CYANOCOBALAMIN 1000 MCG/ML IJ SOLN: 1000.0000 ug | Freq: Once | INTRAMUSCULAR | Status: DC

## 2020-09-02 MED ORDER — MORPHINE SULFATE (PF) 2 MG/ML IV SOLN
4.0000 mg | Freq: Once | INTRAVENOUS | Status: AC
  Administered 2020-09-02: 4 mg via INTRAVENOUS
  Filled 2020-09-02: qty 2

## 2020-09-02 MED ORDER — SODIUM CHLORIDE 0.9 % IV SOLN
200.0000 mg | Freq: Once | INTRAVENOUS | Status: AC
  Administered 2020-09-02: 200 mg via INTRAVENOUS
  Filled 2020-09-02: qty 8

## 2020-09-02 NOTE — Progress Notes (Signed)
Patient stated that legs felt much better after morphine injection. Tolerated treatment well. Stable at discharge

## 2020-09-02 NOTE — Progress Notes (Signed)
Per Dr. Janese Banks, Reynolds to treat with elevated HR.
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
## 2020-09-06 ENCOUNTER — Other Ambulatory Visit: Payer: Self-pay | Admitting: *Deleted

## 2020-09-06 NOTE — Telephone Encounter (Signed)
Ryan Harvey called asking for something to help with appetite and said that he has not really eaten much for past 3 weeks and they forgot to ask at his recent appointment. She is also requesting a refill on his MS

## 2020-09-07 MED ORDER — MORPHINE SULFATE 15 MG PO TABS: 15.0000 mg | ORAL_TABLET | Freq: Four times a day (QID) | ORAL | 0 refills | Status: DC | PRN

## 2020-09-07 MED ORDER — OLANZAPINE 10 MG PO TABS: 10.0000 mg | ORAL_TABLET | Freq: Every day | ORAL | 0 refills | Status: DC

## 2020-09-07 NOTE — Telephone Encounter (Signed)
Ok to refill MS. We can start zyprexa 10 mg qhs for appetite

## 2020-09-13 DIAGNOSIS — E876 Hypokalemia: Secondary | ICD-10-CM | POA: Diagnosis not present

## 2020-09-14 ENCOUNTER — Other Ambulatory Visit (HOSPITAL_COMMUNITY): Payer: Self-pay | Admitting: Family Medicine

## 2020-09-14 ENCOUNTER — Other Ambulatory Visit: Payer: Self-pay | Admitting: Family Medicine

## 2020-09-14 DIAGNOSIS — M5416 Radiculopathy, lumbar region: Secondary | ICD-10-CM

## 2020-09-14 DIAGNOSIS — R3914 Feeling of incomplete bladder emptying: Secondary | ICD-10-CM | POA: Diagnosis not present

## 2020-09-14 DIAGNOSIS — I493 Ventricular premature depolarization: Secondary | ICD-10-CM | POA: Diagnosis not present

## 2020-09-14 DIAGNOSIS — R000 Tachycardia, unspecified: Secondary | ICD-10-CM | POA: Diagnosis not present

## 2020-09-15 ENCOUNTER — Ambulatory Visit: Admission: RE | Admit: 2020-09-15 | Payer: Medicare HMO | Source: Ambulatory Visit

## 2020-09-15 ENCOUNTER — Emergency Department: Payer: Medicare HMO

## 2020-09-15 ENCOUNTER — Other Ambulatory Visit: Payer: Self-pay

## 2020-09-15 ENCOUNTER — Emergency Department
Admission: EM | Admit: 2020-09-15 | Discharge: 2020-09-16 | Disposition: A | Discharge status: Home / Self Care | Payer: Medicare HMO | Attending: Emergency Medicine | Admitting: Emergency Medicine

## 2020-09-15 DIAGNOSIS — Z87891 Personal history of nicotine dependence: Secondary | ICD-10-CM | POA: Diagnosis not present

## 2020-09-15 DIAGNOSIS — I2510 Atherosclerotic heart disease of native coronary artery without angina pectoris: Secondary | ICD-10-CM | POA: Insufficient documentation

## 2020-09-15 DIAGNOSIS — G9520 Unspecified cord compression: Secondary | ICD-10-CM | POA: Insufficient documentation

## 2020-09-15 DIAGNOSIS — Z79899 Other long term (current) drug therapy: Secondary | ICD-10-CM | POA: Diagnosis not present

## 2020-09-15 DIAGNOSIS — Z85118 Personal history of other malignant neoplasm of bronchus and lung: Secondary | ICD-10-CM | POA: Diagnosis not present

## 2020-09-15 DIAGNOSIS — M549 Dorsalgia, unspecified: Secondary | ICD-10-CM | POA: Diagnosis not present

## 2020-09-15 DIAGNOSIS — R1032 Left lower quadrant pain: Secondary | ICD-10-CM | POA: Diagnosis not present

## 2020-09-15 DIAGNOSIS — G893 Neoplasm related pain (acute) (chronic): Secondary | ICD-10-CM | POA: Diagnosis not present

## 2020-09-15 DIAGNOSIS — R200 Anesthesia of skin: Secondary | ICD-10-CM | POA: Diagnosis not present

## 2020-09-15 DIAGNOSIS — Z7902 Long term (current) use of antithrombotics/antiplatelets: Secondary | ICD-10-CM | POA: Diagnosis not present

## 2020-09-15 DIAGNOSIS — R1031 Right lower quadrant pain: Secondary | ICD-10-CM | POA: Insufficient documentation

## 2020-09-15 DIAGNOSIS — C7951 Secondary malignant neoplasm of bone: Secondary | ICD-10-CM | POA: Insufficient documentation

## 2020-09-15 DIAGNOSIS — I10 Essential (primary) hypertension: Secondary | ICD-10-CM | POA: Insufficient documentation

## 2020-09-15 DIAGNOSIS — M545 Low back pain, unspecified: Secondary | ICD-10-CM | POA: Diagnosis not present

## 2020-09-15 DIAGNOSIS — C801 Malignant (primary) neoplasm, unspecified: Secondary | ICD-10-CM | POA: Diagnosis not present

## 2020-09-15 DIAGNOSIS — R000 Tachycardia, unspecified: Secondary | ICD-10-CM | POA: Diagnosis not present

## 2020-09-15 DIAGNOSIS — M4846XA Fatigue fracture of vertebra, lumbar region, initial encounter for fracture: Secondary | ICD-10-CM | POA: Diagnosis not present

## 2020-09-15 DIAGNOSIS — R109 Unspecified abdominal pain: Secondary | ICD-10-CM | POA: Diagnosis not present

## 2020-09-15 DIAGNOSIS — R0902 Hypoxemia: Secondary | ICD-10-CM | POA: Diagnosis not present

## 2020-09-15 DIAGNOSIS — C3490 Malignant neoplasm of unspecified part of unspecified bronchus or lung: Secondary | ICD-10-CM | POA: Diagnosis not present

## 2020-09-15 LAB — CBC WITH DIFFERENTIAL/PLATELET
Abs Immature Granulocytes: 0.18 10*3/uL — ABNORMAL HIGH (ref 0.00–0.07)
Basophils Absolute: 0 10*3/uL (ref 0.0–0.1)
Basophils Relative: 0 %
Eosinophils Absolute: 0 10*3/uL (ref 0.0–0.5)
Eosinophils Relative: 0 %
HCT: 36.6 % — ABNORMAL LOW (ref 39.0–52.0)
Hemoglobin: 11.4 g/dL — ABNORMAL LOW (ref 13.0–17.0)
Immature Granulocytes: 1 %
Lymphocytes Relative: 7 %
Lymphs Abs: 0.9 10*3/uL (ref 0.7–4.0)
MCH: 29.2 pg (ref 26.0–34.0)
MCHC: 31.1 g/dL (ref 30.0–36.0)
MCV: 93.6 fL (ref 80.0–100.0)
Monocytes Absolute: 2.2 10*3/uL — ABNORMAL HIGH (ref 0.1–1.0)
Monocytes Relative: 16 %
Neutro Abs: 10.4 10*3/uL — ABNORMAL HIGH (ref 1.7–7.7)
Neutrophils Relative %: 76 %
Platelets: 221 10*3/uL (ref 150–400)
RBC: 3.91 MIL/uL — ABNORMAL LOW (ref 4.22–5.81)
RDW: 14.6 % (ref 11.5–15.5)
WBC: 13.8 10*3/uL — ABNORMAL HIGH (ref 4.0–10.5)
nRBC: 0 % (ref 0.0–0.2)

## 2020-09-15 LAB — COMPREHENSIVE METABOLIC PANEL
ALT: 45 U/L — ABNORMAL HIGH (ref 0–44)
AST: 53 U/L — ABNORMAL HIGH (ref 15–41)
Albumin: 3 g/dL — ABNORMAL LOW (ref 3.5–5.0)
Alkaline Phosphatase: 71 U/L (ref 38–126)
Anion gap: 11 (ref 5–15)
BUN: 10 mg/dL (ref 8–23)
CO2: 25 mmol/L (ref 22–32)
Calcium: 9.1 mg/dL (ref 8.9–10.3)
Chloride: 98 mmol/L (ref 98–111)
Creatinine, Ser: 0.79 mg/dL (ref 0.61–1.24)
GFR, Estimated: >60 mL/min (ref >60)
Glucose, Bld: 109 mg/dL — ABNORMAL HIGH (ref 70–99)
Potassium: 3.5 mmol/L (ref 3.5–5.1)
Sodium: 134 mmol/L — ABNORMAL LOW (ref 135–145)
Total Bilirubin: 1.1 mg/dL (ref 0.3–1.2)
Total Protein: 7.5 g/dL (ref 6.5–8.1)

## 2020-09-15 LAB — LIPASE, BLOOD: Lipase: 22 U/L (ref 11–51)

## 2020-09-15 MED ORDER — IOHEXOL 300 MG/ML  SOLN
100.0000 mL | Freq: Once | INTRAMUSCULAR | Status: AC | PRN
  Administered 2020-09-15: 100 mL via INTRAVENOUS

## 2020-09-15 MED ORDER — LACTATED RINGERS IV BOLUS
1000.0000 mL | Freq: Once | INTRAVENOUS | Status: AC
  Administered 2020-09-15: 1000 mL via INTRAVENOUS

## 2020-09-15 MED ORDER — MORPHINE SULFATE (PF) 4 MG/ML IV SOLN
4.0000 mg | Freq: Once | INTRAVENOUS | Status: AC
Start: 2020-09-15 — End: 2020-09-15
  Administered 2020-09-15: 4 mg via INTRAVENOUS
  Filled 2020-09-15: qty 1

## 2020-09-15 MED ORDER — LORAZEPAM 2 MG/ML IJ SOLN: 1.0000 mg | INTRAMUSCULAR | Status: DC | PRN

## 2020-09-15 NOTE — ED Notes (Signed)
Pt is resting in stretcher. NAD. Respirations equal and unlabored.   Pt has been given water
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
## 2020-09-15 NOTE — ED Notes (Signed)
RN has found pt at edge of the bed again attempting to get up. RN re-educated pt on safety risks of walking without assistance since pt is unable to walk.  Pt declined to verbalize understanding.

## 2020-09-15 NOTE — ED Notes (Signed)
RN found pt at edge of bed attempting to get up. RN educated pt on safety, and falls risk since pt is unable to walk.   RN has helped pt back into bed.

## 2020-09-15 NOTE — ED Notes (Signed)
Pt refusing to use bedpan and urinal. Pt is unable to walk, but insists on walking to bedside commode.   RN educated pt on safety risks and falling. Pt stated he is unwilling to use the pan at this time and will "just hold it."

## 2020-09-15 NOTE — ED Notes (Signed)
Pt has ripped out port access stating "it itched."

## 2020-09-15 NOTE — ED Triage Notes (Addendum)
Pt coming from home with a c/c of back pain. onest : yesterday, also states having difficulties with ambulation
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
## 2020-09-16 DIAGNOSIS — C3491 Malignant neoplasm of unspecified part of right bronchus or lung: Secondary | ICD-10-CM | POA: Diagnosis not present

## 2020-09-16 DIAGNOSIS — I10 Essential (primary) hypertension: Secondary | ICD-10-CM | POA: Diagnosis not present

## 2020-09-16 DIAGNOSIS — C7951 Secondary malignant neoplasm of bone: Secondary | ICD-10-CM | POA: Diagnosis not present

## 2020-09-16 DIAGNOSIS — Z743 Need for continuous supervision: Secondary | ICD-10-CM | POA: Diagnosis not present

## 2020-09-16 DIAGNOSIS — R1032 Left lower quadrant pain: Secondary | ICD-10-CM | POA: Diagnosis not present

## 2020-09-16 DIAGNOSIS — C801 Malignant (primary) neoplasm, unspecified: Secondary | ICD-10-CM | POA: Diagnosis not present

## 2020-09-16 DIAGNOSIS — Z85118 Personal history of other malignant neoplasm of bronchus and lung: Secondary | ICD-10-CM | POA: Diagnosis not present

## 2020-09-16 DIAGNOSIS — D72829 Elevated white blood cell count, unspecified: Secondary | ICD-10-CM | POA: Diagnosis not present

## 2020-09-16 DIAGNOSIS — D649 Anemia, unspecified: Secondary | ICD-10-CM | POA: Diagnosis not present

## 2020-09-16 DIAGNOSIS — G9529 Other cord compression: Secondary | ICD-10-CM | POA: Diagnosis not present

## 2020-09-16 DIAGNOSIS — M4802 Spinal stenosis, cervical region: Secondary | ICD-10-CM | POA: Diagnosis not present

## 2020-09-16 DIAGNOSIS — G893 Neoplasm related pain (acute) (chronic): Secondary | ICD-10-CM | POA: Diagnosis not present

## 2020-09-16 DIAGNOSIS — C7949 Secondary malignant neoplasm of other parts of nervous system: Secondary | ICD-10-CM | POA: Diagnosis not present

## 2020-09-16 DIAGNOSIS — R918 Other nonspecific abnormal finding of lung field: Secondary | ICD-10-CM | POA: Diagnosis not present

## 2020-09-16 DIAGNOSIS — R200 Anesthesia of skin: Secondary | ICD-10-CM | POA: Diagnosis not present

## 2020-09-16 DIAGNOSIS — M8448XA Pathological fracture, other site, initial encounter for fracture: Secondary | ICD-10-CM | POA: Diagnosis not present

## 2020-09-16 DIAGNOSIS — C7989 Secondary malignant neoplasm of other specified sites: Secondary | ICD-10-CM | POA: Diagnosis not present

## 2020-09-16 DIAGNOSIS — C412 Malignant neoplasm of vertebral column: Secondary | ICD-10-CM | POA: Diagnosis not present

## 2020-09-16 DIAGNOSIS — R1031 Right lower quadrant pain: Secondary | ICD-10-CM | POA: Diagnosis not present

## 2020-09-16 DIAGNOSIS — I2510 Atherosclerotic heart disease of native coronary artery without angina pectoris: Secondary | ICD-10-CM | POA: Diagnosis not present

## 2020-09-16 DIAGNOSIS — G992 Myelopathy in diseases classified elsewhere: Secondary | ICD-10-CM | POA: Diagnosis not present

## 2020-09-16 DIAGNOSIS — M4804 Spinal stenosis, thoracic region: Secondary | ICD-10-CM | POA: Diagnosis not present

## 2020-09-16 DIAGNOSIS — R279 Unspecified lack of coordination: Secondary | ICD-10-CM | POA: Diagnosis not present

## 2020-09-16 DIAGNOSIS — S335XXA Sprain of ligaments of lumbar spine, initial encounter: Secondary | ICD-10-CM | POA: Diagnosis not present

## 2020-09-16 DIAGNOSIS — K769 Liver disease, unspecified: Secondary | ICD-10-CM | POA: Diagnosis not present

## 2020-09-16 DIAGNOSIS — G928 Other toxic encephalopathy: Secondary | ICD-10-CM | POA: Diagnosis not present

## 2020-09-16 DIAGNOSIS — G9520 Unspecified cord compression: Secondary | ICD-10-CM | POA: Diagnosis not present

## 2020-09-16 DIAGNOSIS — G959 Disease of spinal cord, unspecified: Secondary | ICD-10-CM | POA: Diagnosis not present

## 2020-09-16 DIAGNOSIS — M5416 Radiculopathy, lumbar region: Secondary | ICD-10-CM | POA: Diagnosis not present

## 2020-09-16 DIAGNOSIS — C3490 Malignant neoplasm of unspecified part of unspecified bronchus or lung: Secondary | ICD-10-CM | POA: Diagnosis not present

## 2020-09-16 DIAGNOSIS — C3401 Malignant neoplasm of right main bronchus: Secondary | ICD-10-CM | POA: Diagnosis not present

## 2020-09-16 DIAGNOSIS — C782 Secondary malignant neoplasm of pleura: Secondary | ICD-10-CM | POA: Diagnosis not present

## 2020-09-16 DIAGNOSIS — J910 Malignant pleural effusion: Secondary | ICD-10-CM | POA: Diagnosis not present

## 2020-09-16 MED ORDER — POTASSIUM CHLORIDE CRYS ER 20 MEQ PO TBCR
20.0000 meq | EXTENDED_RELEASE_TABLET | Freq: Every day | ORAL | Status: DC
  Administered 2020-09-16: 20 meq via ORAL
  Filled 2020-09-16: qty 1

## 2020-09-16 MED ORDER — ATORVASTATIN CALCIUM 20 MG PO TABS
80.0000 mg | ORAL_TABLET | Freq: Every day | ORAL | Status: DC
  Administered 2020-09-16: 80 mg via ORAL
  Filled 2020-09-16: qty 4

## 2020-09-16 MED ORDER — CYCLOBENZAPRINE HCL 10 MG PO TABS: 10.0000 mg | ORAL_TABLET | Freq: Three times a day (TID) | ORAL | Status: DC | PRN

## 2020-09-16 MED ORDER — AMLODIPINE BESYLATE 5 MG PO TABS
5.0000 mg | ORAL_TABLET | ORAL | Status: DC
Start: 2020-09-17 — End: 2020-09-17

## 2020-09-16 MED ORDER — MORPHINE SULFATE 15 MG PO TABS
15.0000 mg | ORAL_TABLET | Freq: Four times a day (QID) | ORAL | Status: DC | PRN
  Administered 2020-09-16 (×2): 15 mg via ORAL
  Filled 2020-09-16 (×5): qty 1

## 2020-09-16 MED ORDER — OLANZAPINE 10 MG PO TABS
10.0000 mg | ORAL_TABLET | Freq: Every day | ORAL | Status: DC
Start: 2020-09-16 — End: 2020-09-17

## 2020-09-16 MED ORDER — PREGABALIN 75 MG PO CAPS
75.0000 mg | ORAL_CAPSULE | Freq: Two times a day (BID) | ORAL | Status: DC
  Administered 2020-09-16: 75 mg via ORAL
  Filled 2020-09-16: qty 1

## 2020-09-16 NOTE — ED Notes (Signed)
Call made to [facility] to get an updated status on bed assignment spoke to [employer] who stated as of this time there still is no bed available. Kathie Rhodes RN aware

## 2020-09-16 NOTE — ED Notes (Signed)
Called [facility] to get an update on the status of the bed availability for patient per Tressie Ellis no bed assignment at this time. Jeannette How RN aware of status

## 2020-09-16 NOTE — ED Notes (Signed)
[facility] transfer for oncology per Charna Archer, MD Spoke with [name]. Will page out to oncology

## 2020-09-16 NOTE — ED Notes (Signed)
Called [facility] transfer for transfer to Neurosurgery / Oncology per Shan Levans w/ Joy at transfer center, she will page out

## 2020-09-16 NOTE — ED Provider Notes (Signed)
-----------------------------------------   6:57 AM on 09/16/2020 -----------------------------------------  Patient remained in the ED overnight pending bed assignment at [facility].  Pain controlled with home medications.  Care transferred to oncoming provider pending transfer.   Paulette Blanch, MD 09/16/20 [phone]

## 2020-09-16 NOTE — ED Notes (Signed)
Per ED secretary, no bed available at [facility] yet.

## 2020-09-16 NOTE — ED Notes (Signed)
Called [facility] to check on bed status . Per Ryan Harvey no bed available at this time

## 2020-09-16 NOTE — ED Notes (Signed)
[facility] returned call and spoke with Charna Archer, MD . Will not be accepting pt due to being full

## 2020-09-16 NOTE — ED Notes (Signed)
[facility] transfer (Ryan Harvey) called backand spoke to [place], MD. Pt accepted to [facility] room assignment

## 2020-09-16 NOTE — ED Notes (Signed)
Assigned as Primary RN at this time. Patient is lying comfortably, in no acute distress, and awaiting a bed assigned with transfer to [facility]. Patient is refusing to have an IV or get his Port accessed until time of transfer. Will continue to monitor and assess.

## 2020-09-16 NOTE — ED Notes (Signed)
Called [facility] to request transfer with oncology per Charna Archer, MD.  All lines were busy, on hold 12 mins No answer

## 2020-09-16 NOTE — ED Notes (Signed)
Called [facility]) with bed placement. No bed assigned at this time

## 2020-09-16 NOTE — ED Notes (Signed)
[facility] transfer (Ryan Harvey)called back and said oncology is checking to see bed status and will let us know

## 2020-09-16 NOTE — ED Notes (Signed)
[facility] ([facility]) returned call and spoke with Charna Archer, MD concerning transfer

## 2020-09-16 NOTE — ED Notes (Signed)
Called [facility] back for oncology transfer per Charna Archer, MD. All lines busy, on hold for 15 mins.. No answer

## 2020-09-16 NOTE — ED Notes (Signed)
Called [facility] to request transfer to oncology per Charna Archer, MD. All lines busy, on hold for 6 mins. No answer

## 2020-09-16 NOTE — ED Notes (Addendum)
Per Renetta Chalk Md request call made to [facility] to get update on bed status per Amy @ this time no bed assignment available yet.  Jeannette How RN made aware

## 2020-09-16 NOTE — ED Notes (Signed)
Called [facility] (nicki) to request transfer w/ neurosurgery per Charna Archer MD

## 2020-09-16 NOTE — ED Notes (Addendum)
Per Dr. Beather Arbour, pt okay to take his PO 15mg  morphine he takes nightly for chronic pain. Pt's wife has medication at bedside. Pt requesting to wait to access port again until it is time for transfer.

## 2020-09-17 DIAGNOSIS — M4804 Spinal stenosis, thoracic region: Secondary | ICD-10-CM | POA: Diagnosis not present

## 2020-09-17 DIAGNOSIS — M8448XA Pathological fracture, other site, initial encounter for fracture: Secondary | ICD-10-CM | POA: Diagnosis not present

## 2020-09-17 DIAGNOSIS — D72829 Elevated white blood cell count, unspecified: Secondary | ICD-10-CM | POA: Diagnosis not present

## 2020-09-17 DIAGNOSIS — C7989 Secondary malignant neoplasm of other specified sites: Secondary | ICD-10-CM | POA: Diagnosis not present

## 2020-09-17 DIAGNOSIS — C3490 Malignant neoplasm of unspecified part of unspecified bronchus or lung: Secondary | ICD-10-CM | POA: Diagnosis not present

## 2020-09-17 DIAGNOSIS — C7951 Secondary malignant neoplasm of bone: Secondary | ICD-10-CM | POA: Diagnosis not present

## 2020-09-17 DIAGNOSIS — C412 Malignant neoplasm of vertebral column: Secondary | ICD-10-CM | POA: Diagnosis not present

## 2020-09-17 DIAGNOSIS — G9520 Unspecified cord compression: Secondary | ICD-10-CM | POA: Diagnosis not present

## 2020-09-17 DIAGNOSIS — S335XXA Sprain of ligaments of lumbar spine, initial encounter: Secondary | ICD-10-CM | POA: Diagnosis not present

## 2020-09-17 DIAGNOSIS — D649 Anemia, unspecified: Secondary | ICD-10-CM | POA: Diagnosis not present

## 2020-09-17 DIAGNOSIS — G893 Neoplasm related pain (acute) (chronic): Secondary | ICD-10-CM | POA: Diagnosis not present

## 2020-09-17 DIAGNOSIS — K769 Liver disease, unspecified: Secondary | ICD-10-CM | POA: Diagnosis not present

## 2020-09-17 DIAGNOSIS — M4802 Spinal stenosis, cervical region: Secondary | ICD-10-CM | POA: Diagnosis not present

## 2020-09-17 DIAGNOSIS — R918 Other nonspecific abnormal finding of lung field: Secondary | ICD-10-CM | POA: Diagnosis not present

## 2020-09-17 DIAGNOSIS — C3491 Malignant neoplasm of unspecified part of right bronchus or lung: Secondary | ICD-10-CM | POA: Diagnosis not present

## 2020-09-18 DIAGNOSIS — K769 Liver disease, unspecified: Secondary | ICD-10-CM | POA: Diagnosis not present

## 2020-09-18 DIAGNOSIS — C412 Malignant neoplasm of vertebral column: Secondary | ICD-10-CM | POA: Diagnosis not present

## 2020-09-18 DIAGNOSIS — C3490 Malignant neoplasm of unspecified part of unspecified bronchus or lung: Secondary | ICD-10-CM | POA: Diagnosis not present

## 2020-09-18 DIAGNOSIS — C3401 Malignant neoplasm of right main bronchus: Secondary | ICD-10-CM | POA: Diagnosis not present

## 2020-09-18 DIAGNOSIS — G9520 Unspecified cord compression: Secondary | ICD-10-CM | POA: Diagnosis not present

## 2020-09-18 DIAGNOSIS — C7951 Secondary malignant neoplasm of bone: Secondary | ICD-10-CM | POA: Diagnosis not present

## 2020-09-20 ENCOUNTER — Ambulatory Visit: Admission: RE | Admit: 2020-09-20 | Payer: Medicare HMO | Source: Ambulatory Visit

## 2020-09-20 DIAGNOSIS — M5416 Radiculopathy, lumbar region: Secondary | ICD-10-CM | POA: Diagnosis not present

## 2020-09-20 DIAGNOSIS — C7951 Secondary malignant neoplasm of bone: Secondary | ICD-10-CM | POA: Diagnosis not present

## 2020-09-20 DIAGNOSIS — C3401 Malignant neoplasm of right main bronchus: Secondary | ICD-10-CM | POA: Diagnosis not present

## 2020-09-22 ENCOUNTER — Other Ambulatory Visit: Payer: Self-pay | Admitting: Oncology

## 2020-09-22 ENCOUNTER — Telehealth: Payer: Self-pay | Admitting: *Deleted

## 2020-09-22 ENCOUNTER — Ambulatory Visit
Admission: RE | Admit: 2020-09-22 | Discharge: 2020-09-22 | Disposition: A | Discharge status: Home / Self Care | Payer: Self-pay | Source: Ambulatory Visit | Attending: Oncology | Admitting: Oncology

## 2020-09-22 DIAGNOSIS — G9529 Other cord compression: Secondary | ICD-10-CM | POA: Diagnosis not present

## 2020-09-22 DIAGNOSIS — C3491 Malignant neoplasm of unspecified part of right bronchus or lung: Secondary | ICD-10-CM

## 2020-09-22 DIAGNOSIS — C799 Secondary malignant neoplasm of unspecified site: Secondary | ICD-10-CM | POA: Diagnosis not present

## 2020-09-22 DIAGNOSIS — C3490 Malignant neoplasm of unspecified part of unspecified bronchus or lung: Secondary | ICD-10-CM | POA: Diagnosis not present

## 2020-09-22 DIAGNOSIS — R918 Other nonspecific abnormal finding of lung field: Secondary | ICD-10-CM

## 2020-09-22 DIAGNOSIS — Z923 Personal history of irradiation: Secondary | ICD-10-CM | POA: Diagnosis not present

## 2020-09-22 NOTE — Telephone Encounter (Signed)
Ryan Harvey has been made aware of appt change. Nothing further needed.

## 2020-09-22 NOTE — Telephone Encounter (Signed)
[name] called reporting that patient is getting radiation therapy at [facility] and has 6 more treatments before he is done and that [facility] discharged him from hospital yesterday. His radiation therapy appointment is for 1PM and he has appointment with Dr Janese Banks in morning for lab/md/inf [id number]; she is asking if that appointment could be any earlier in morning so that he can get his radiation therapy at 1, I told her that that appointment was about the earliest he can get. She said she would keep it "I guess" if not able to come in earlier. Please advise if patient needs to keep this appointment or does it need to be rescheduled? He has Cord Compression which is why he needs radiation therapy. And from the notes I read in care Everywhere they are planning to do more radiation therapy in another area of his spine when he is done with current 10 fractions.

## 2020-09-22 NOTE — Telephone Encounter (Signed)
Per Norvel Richards, RN, appointment is to be rescheduled for next week and she will call [name] to let her know this with new appointment date and time.

## 2020-09-22 NOTE — Telephone Encounter (Signed)
Spoke with Ryan Harvey and reviewed upcoming appt on Thurs 4/14 at 10:15am for hospital follow up with Dr. Janese Banks. Ryan Harvey stated that Ryan Harvey will finish radiation on 4/13. All questions answered during call. Instructed to call back with any further questions or needs. Ryan Harvey verbalized understanding.

## 2020-09-23 ENCOUNTER — Inpatient Hospital Stay: Payer: Medicare HMO

## 2020-09-23 ENCOUNTER — Inpatient Hospital Stay: Payer: Medicare HMO | Admitting: Oncology

## 2020-09-23 DIAGNOSIS — Z923 Personal history of irradiation: Secondary | ICD-10-CM | POA: Diagnosis not present

## 2020-09-23 DIAGNOSIS — C799 Secondary malignant neoplasm of unspecified site: Secondary | ICD-10-CM | POA: Diagnosis not present

## 2020-09-23 DIAGNOSIS — G9529 Other cord compression: Secondary | ICD-10-CM | POA: Diagnosis not present

## 2020-09-23 DIAGNOSIS — C3490 Malignant neoplasm of unspecified part of unspecified bronchus or lung: Secondary | ICD-10-CM | POA: Diagnosis not present

## 2020-09-24 DIAGNOSIS — Z923 Personal history of irradiation: Secondary | ICD-10-CM | POA: Diagnosis not present

## 2020-09-24 DIAGNOSIS — C799 Secondary malignant neoplasm of unspecified site: Secondary | ICD-10-CM | POA: Diagnosis not present

## 2020-09-24 DIAGNOSIS — C3490 Malignant neoplasm of unspecified part of unspecified bronchus or lung: Secondary | ICD-10-CM | POA: Diagnosis not present

## 2020-09-24 DIAGNOSIS — G9529 Other cord compression: Secondary | ICD-10-CM | POA: Diagnosis not present

## 2020-09-27 ENCOUNTER — Encounter: Payer: Self-pay | Admitting: *Deleted

## 2020-09-27 DIAGNOSIS — C3490 Malignant neoplasm of unspecified part of unspecified bronchus or lung: Secondary | ICD-10-CM

## 2020-09-27 DIAGNOSIS — G9529 Other cord compression: Secondary | ICD-10-CM | POA: Diagnosis not present

## 2020-09-27 DIAGNOSIS — C799 Secondary malignant neoplasm of unspecified site: Secondary | ICD-10-CM | POA: Diagnosis not present

## 2020-09-27 DIAGNOSIS — Z923 Personal history of irradiation: Secondary | ICD-10-CM | POA: Diagnosis not present

## 2020-09-27 NOTE — Telephone Encounter (Signed)
I called the friend. She was telling me about a ct scan on wed this week and arrive at 12 noon. There is no appt for that date. It has 4/14 labs and see md and then 4/20 has pet/ct scan and went over that. She is clear now and when pt comes  on 4/14 then we will give a print out and she is good with that. His radiation is at 4:30 at [facility]

## 2020-09-27 NOTE — Progress Notes (Signed)
  Oncology Nurse Navigator Documentation  Navigator Location: [facility] (09/27/20 1100)   )Navigator Encounter Type: Other: (09/27/20 1100)                         Barriers/Navigation Needs: Coordination of Care (09/27/20 1100)   Interventions: Coordination of Care (09/27/20 1100)   Coordination of Care: Radiology (09/27/20 1100)       assisted with coordinating care. Per Dr. Janese Banks, pt will need repeat PET scan. Will start authorization process and schedule pt for PET. Pt will be notified once scheduled.            Time Spent with Patient: 30 (09/27/20 1100)

## 2020-09-28 DIAGNOSIS — G928 Other toxic encephalopathy: Secondary | ICD-10-CM | POA: Diagnosis not present

## 2020-09-28 DIAGNOSIS — K5900 Constipation, unspecified: Secondary | ICD-10-CM | POA: Diagnosis not present

## 2020-09-28 DIAGNOSIS — C7951 Secondary malignant neoplasm of bone: Secondary | ICD-10-CM | POA: Diagnosis not present

## 2020-09-28 DIAGNOSIS — C799 Secondary malignant neoplasm of unspecified site: Secondary | ICD-10-CM | POA: Diagnosis not present

## 2020-09-28 DIAGNOSIS — G9529 Other cord compression: Secondary | ICD-10-CM | POA: Diagnosis not present

## 2020-09-28 DIAGNOSIS — M5416 Radiculopathy, lumbar region: Secondary | ICD-10-CM | POA: Diagnosis not present

## 2020-09-28 DIAGNOSIS — J910 Malignant pleural effusion: Secondary | ICD-10-CM | POA: Diagnosis not present

## 2020-09-28 DIAGNOSIS — M8458XD Pathological fracture in neoplastic disease, other specified site, subsequent encounter for fracture with routine healing: Secondary | ICD-10-CM | POA: Diagnosis not present

## 2020-09-28 DIAGNOSIS — C3491 Malignant neoplasm of unspecified part of right bronchus or lung: Secondary | ICD-10-CM | POA: Diagnosis not present

## 2020-09-28 DIAGNOSIS — Z923 Personal history of irradiation: Secondary | ICD-10-CM | POA: Diagnosis not present

## 2020-09-28 DIAGNOSIS — C3490 Malignant neoplasm of unspecified part of unspecified bronchus or lung: Secondary | ICD-10-CM | POA: Diagnosis not present

## 2020-09-28 DIAGNOSIS — G992 Myelopathy in diseases classified elsewhere: Secondary | ICD-10-CM | POA: Diagnosis not present

## 2020-09-28 DIAGNOSIS — C782 Secondary malignant neoplasm of pleura: Secondary | ICD-10-CM | POA: Diagnosis not present

## 2020-09-29 ENCOUNTER — Other Ambulatory Visit: Payer: Self-pay | Admitting: *Deleted

## 2020-09-29 DIAGNOSIS — Z923 Personal history of irradiation: Secondary | ICD-10-CM | POA: Diagnosis not present

## 2020-09-29 DIAGNOSIS — C3491 Malignant neoplasm of unspecified part of right bronchus or lung: Secondary | ICD-10-CM

## 2020-09-29 DIAGNOSIS — C799 Secondary malignant neoplasm of unspecified site: Secondary | ICD-10-CM | POA: Diagnosis not present

## 2020-09-29 DIAGNOSIS — C3490 Malignant neoplasm of unspecified part of unspecified bronchus or lung: Secondary | ICD-10-CM | POA: Diagnosis not present

## 2020-09-29 DIAGNOSIS — G9529 Other cord compression: Secondary | ICD-10-CM | POA: Diagnosis not present

## 2020-09-30 ENCOUNTER — Other Ambulatory Visit: Payer: Self-pay | Admitting: *Deleted

## 2020-09-30 ENCOUNTER — Encounter: Payer: Self-pay | Admitting: Oncology

## 2020-09-30 ENCOUNTER — Inpatient Hospital Stay (HOSPITAL_BASED_OUTPATIENT_CLINIC_OR_DEPARTMENT_OTHER): Payer: Medicare HMO | Admitting: Oncology

## 2020-09-30 ENCOUNTER — Inpatient Hospital Stay: Payer: Medicare HMO | Attending: Oncology

## 2020-09-30 VITALS — BP 117/63 | HR 71 | Temp 97.2°F | Resp 16 | Ht 69.0 in | Wt 141.0 lb

## 2020-09-30 DIAGNOSIS — Z79899 Other long term (current) drug therapy: Secondary | ICD-10-CM | POA: Diagnosis not present

## 2020-09-30 DIAGNOSIS — G893 Neoplasm related pain (acute) (chronic): Secondary | ICD-10-CM | POA: Insufficient documentation

## 2020-09-30 DIAGNOSIS — R432 Parageusia: Secondary | ICD-10-CM | POA: Diagnosis not present

## 2020-09-30 DIAGNOSIS — Z7189 Other specified counseling: Secondary | ICD-10-CM | POA: Diagnosis not present

## 2020-09-30 DIAGNOSIS — R531 Weakness: Secondary | ICD-10-CM | POA: Diagnosis not present

## 2020-09-30 DIAGNOSIS — I10 Essential (primary) hypertension: Secondary | ICD-10-CM | POA: Diagnosis not present

## 2020-09-30 DIAGNOSIS — R5383 Other fatigue: Secondary | ICD-10-CM | POA: Diagnosis not present

## 2020-09-30 DIAGNOSIS — Z87891 Personal history of nicotine dependence: Secondary | ICD-10-CM | POA: Diagnosis not present

## 2020-09-30 DIAGNOSIS — D696 Thrombocytopenia, unspecified: Secondary | ICD-10-CM | POA: Diagnosis not present

## 2020-09-30 DIAGNOSIS — C3491 Malignant neoplasm of unspecified part of right bronchus or lung: Secondary | ICD-10-CM | POA: Insufficient documentation

## 2020-09-30 DIAGNOSIS — C782 Secondary malignant neoplasm of pleura: Secondary | ICD-10-CM | POA: Insufficient documentation

## 2020-09-30 LAB — BASIC METABOLIC PANEL
Anion gap: 9 (ref 5–15)
BUN: 15 mg/dL (ref 8–23)
CO2: 25 mmol/L (ref 22–32)
Calcium: 8.4 mg/dL — ABNORMAL LOW (ref 8.9–10.3)
Chloride: 102 mmol/L (ref 98–111)
Creatinine, Ser: 0.68 mg/dL (ref 0.61–1.24)
GFR, Estimated: >60 mL/min (ref >60)
Glucose, Bld: 105 mg/dL — ABNORMAL HIGH (ref 70–99)
Potassium: 3.8 mmol/L (ref 3.5–5.1)
Sodium: 136 mmol/L (ref 135–145)

## 2020-09-30 LAB — CBC WITH DIFFERENTIAL/PLATELET
Abs Immature Granulocytes: 0.14 10*3/uL — ABNORMAL HIGH (ref 0.00–0.07)
Basophils Absolute: 0 10*3/uL (ref 0.0–0.1)
Basophils Relative: 0 %
Eosinophils Absolute: 0 10*3/uL (ref 0.0–0.5)
Eosinophils Relative: 0 %
HCT: 36.1 % — ABNORMAL LOW (ref 39.0–52.0)
Hemoglobin: 11.5 g/dL — ABNORMAL LOW (ref 13.0–17.0)
Immature Granulocytes: 1 %
Lymphocytes Relative: 2 %
Lymphs Abs: 0.3 10*3/uL — ABNORMAL LOW (ref 0.7–4.0)
MCH: 30.5 pg (ref 26.0–34.0)
MCHC: 31.9 g/dL (ref 30.0–36.0)
MCV: 95.8 fL (ref 80.0–100.0)
Monocytes Absolute: 1.3 10*3/uL — ABNORMAL HIGH (ref 0.1–1.0)
Monocytes Relative: 7 %
Neutro Abs: 17.7 10*3/uL — ABNORMAL HIGH (ref 1.7–7.7)
Neutrophils Relative %: 90 %
Platelets: 204 10*3/uL (ref 150–400)
RBC: 3.77 MIL/uL — ABNORMAL LOW (ref 4.22–5.81)
RDW: 18.3 % — ABNORMAL HIGH (ref 11.5–15.5)
WBC: 19.4 10*3/uL — ABNORMAL HIGH (ref 4.0–10.5)
nRBC: 0 % (ref 0.0–0.2)

## 2020-09-30 MED ORDER — OXYCODONE HCL 10 MG PO TABS: 10.0000 mg | ORAL_TABLET | ORAL | 0 refills | Status: DC | PRN

## 2020-09-30 MED ORDER — ONDANSETRON HCL 8 MG PO TABS: 8.0000 mg | ORAL_TABLET | Freq: Two times a day (BID) | ORAL | 1 refills | Status: DC | PRN

## 2020-09-30 MED ORDER — PROCHLORPERAZINE MALEATE 10 MG PO TABS: 10.0000 mg | ORAL_TABLET | Freq: Four times a day (QID) | ORAL | 1 refills | Status: DC | PRN

## 2020-09-30 MED ORDER — DRONABINOL 2.5 MG PO CAPS: 2.5000 mg | ORAL_CAPSULE | Freq: Two times a day (BID) | ORAL | 0 refills | Status: DC

## 2020-09-30 MED ORDER — LORAZEPAM 0.5 MG PO TABS: 0.5000 mg | ORAL_TABLET | Freq: Four times a day (QID) | ORAL | 0 refills | Status: DC | PRN

## 2020-09-30 MED ORDER — LIDOCAINE-PRILOCAINE 2.5-2.5 % EX CREA: TOPICAL_CREAM | CUTANEOUS | 3 refills | Status: DC

## 2020-09-30 MED ORDER — DEXAMETHASONE 4 MG PO TABS: 8.0000 mg | ORAL_TABLET | Freq: Every day | ORAL | 1 refills | Status: DC

## 2020-09-30 NOTE — Progress Notes (Signed)
DISCONTINUE ON PATHWAY REGIMEN - Non-Small Cell Lung     A cycle is every 21 days:     Pembrolizumab      Pemetrexed      Carboplatin   **Always confirm dose/schedule in your pharmacy ordering system**  REASON: Disease Progression PRIOR TREATMENT: LOS410: Pembrolizumab 200 mg + Pemetrexed 500 mg/m2 + Carboplatin AUC=5 q21 Days x 4 Cycles TREATMENT RESPONSE: Progressive Disease (PD)  START OFF PATHWAY REGIMEN - Non-Small Cell Lung   [id number]:Carboplatin + Paclitaxel (200) + Bevacizumab q21 Days:   A cycle is every 21 days:     Paclitaxel      Carboplatin      Bevacizumab-xxxx   **Always confirm dose/schedule in your pharmacy ordering system**  Patient Characteristics: Stage IV Metastatic, Nonsquamous, Molecular Analysis Completed, Molecular Alteration Present and Targeted Therapy Exhausted OR EGFR Exon 20+ or KRAS G12C+ Present and No Prior Chemo/Immunotherapy OR No Alteration Present, Second Line -  Chemotherapy/Immunotherapy, PS = 0, 1, No Prior PD-1/PD-L1  Inhibitor or Prior PD-1/PD-L1 Inhibitor + Chemotherapy, and Not a Candidate for Immunotherapy Therapeutic Status: Stage IV Metastatic Histology: Nonsquamous Cell Broad Molecular Profiling Status: Engineer, manufacturing Analysis Results: No Alteration Present ECOG Performance Status: 1 Chemotherapy/Immunotherapy Line of Therapy: Second Line Chemotherapy/Immunotherapy Immunotherapy Candidate Status: Not a Candidate for Immunotherapy Prior Immunotherapy Status: Prior PD-1/PD-L1 Inhibitor + Chemotherapy Intent of Therapy: Non-Curative / Palliative Intent, Discussed with Patient

## 2020-09-30 NOTE — Progress Notes (Signed)
Pt not eating as much, everything taste like card board.  Pain at times and using oxycodone 10 mg. Needs refills on his plavix, atorvastatin and amlodipine. I called dr Nehemiah Massed and asked them to give refill for this pt on these meds and got a f/u appt for him at 6/8 10:45. Pt is aware of the request and to check with his pahrmacy.
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
## 2020-10-01 DIAGNOSIS — M8458XD Pathological fracture in neoplastic disease, other specified site, subsequent encounter for fracture with routine healing: Secondary | ICD-10-CM | POA: Diagnosis not present

## 2020-10-01 DIAGNOSIS — J910 Malignant pleural effusion: Secondary | ICD-10-CM | POA: Diagnosis not present

## 2020-10-01 DIAGNOSIS — C7951 Secondary malignant neoplasm of bone: Secondary | ICD-10-CM | POA: Diagnosis not present

## 2020-10-01 DIAGNOSIS — C782 Secondary malignant neoplasm of pleura: Secondary | ICD-10-CM | POA: Diagnosis not present

## 2020-10-01 DIAGNOSIS — M5416 Radiculopathy, lumbar region: Secondary | ICD-10-CM | POA: Diagnosis not present

## 2020-10-01 DIAGNOSIS — C3491 Malignant neoplasm of unspecified part of right bronchus or lung: Secondary | ICD-10-CM | POA: Diagnosis not present

## 2020-10-01 DIAGNOSIS — G928 Other toxic encephalopathy: Secondary | ICD-10-CM | POA: Diagnosis not present

## 2020-10-01 DIAGNOSIS — G992 Myelopathy in diseases classified elsewhere: Secondary | ICD-10-CM | POA: Diagnosis not present

## 2020-10-01 DIAGNOSIS — K5900 Constipation, unspecified: Secondary | ICD-10-CM | POA: Diagnosis not present

## 2020-10-02 DIAGNOSIS — C3491 Malignant neoplasm of unspecified part of right bronchus or lung: Secondary | ICD-10-CM | POA: Diagnosis not present

## 2020-10-02 DIAGNOSIS — G928 Other toxic encephalopathy: Secondary | ICD-10-CM | POA: Diagnosis not present

## 2020-10-02 DIAGNOSIS — C7951 Secondary malignant neoplasm of bone: Secondary | ICD-10-CM | POA: Diagnosis not present

## 2020-10-02 DIAGNOSIS — G992 Myelopathy in diseases classified elsewhere: Secondary | ICD-10-CM | POA: Diagnosis not present

## 2020-10-02 DIAGNOSIS — M5416 Radiculopathy, lumbar region: Secondary | ICD-10-CM | POA: Diagnosis not present

## 2020-10-02 DIAGNOSIS — K5900 Constipation, unspecified: Secondary | ICD-10-CM | POA: Diagnosis not present

## 2020-10-02 DIAGNOSIS — J910 Malignant pleural effusion: Secondary | ICD-10-CM | POA: Diagnosis not present

## 2020-10-02 DIAGNOSIS — C782 Secondary malignant neoplasm of pleura: Secondary | ICD-10-CM | POA: Diagnosis not present

## 2020-10-02 DIAGNOSIS — M8458XD Pathological fracture in neoplastic disease, other specified site, subsequent encounter for fracture with routine healing: Secondary | ICD-10-CM | POA: Diagnosis not present

## 2020-10-06 ENCOUNTER — Ambulatory Visit
Admission: RE | Admit: 2020-10-06 | Discharge: 2020-10-06 | Disposition: A | Discharge status: Home / Self Care | Payer: Medicare HMO | Source: Ambulatory Visit | Attending: Oncology | Admitting: Oncology

## 2020-10-06 ENCOUNTER — Other Ambulatory Visit: Payer: Self-pay

## 2020-10-06 DIAGNOSIS — I700 Atherosclerosis of aorta: Secondary | ICD-10-CM | POA: Insufficient documentation

## 2020-10-06 DIAGNOSIS — C3490 Malignant neoplasm of unspecified part of unspecified bronchus or lung: Secondary | ICD-10-CM | POA: Diagnosis not present

## 2020-10-06 DIAGNOSIS — J439 Emphysema, unspecified: Secondary | ICD-10-CM | POA: Diagnosis not present

## 2020-10-06 DIAGNOSIS — C782 Secondary malignant neoplasm of pleura: Secondary | ICD-10-CM | POA: Diagnosis not present

## 2020-10-06 DIAGNOSIS — I2510 Atherosclerotic heart disease of native coronary artery without angina pectoris: Secondary | ICD-10-CM | POA: Insufficient documentation

## 2020-10-06 LAB — GLUCOSE, CAPILLARY: Glucose-Capillary: 101 mg/dL — ABNORMAL HIGH (ref 70–99)

## 2020-10-06 MED ORDER — FLUDEOXYGLUCOSE F - 18 (FDG) INJECTION
7.3000 | Freq: Once | INTRAVENOUS | Status: AC | PRN
  Administered 2020-10-06: 7.28 via INTRAVENOUS

## 2020-10-06 MED ORDER — HEPARIN SOD (PORK) LOCK FLUSH 100 UNIT/ML IV SOLN
500.0000 [IU] | Freq: Once | INTRAVENOUS | Status: DC
  Filled 2020-10-06: qty 5

## 2020-10-06 MED ORDER — HEPARIN SOD (PORK) LOCK FLUSH 100 UNIT/ML IV SOLN
INTRAVENOUS | Status: AC
  Filled 2020-10-06: qty 5

## 2020-10-07 DIAGNOSIS — G992 Myelopathy in diseases classified elsewhere: Secondary | ICD-10-CM | POA: Diagnosis not present

## 2020-10-07 DIAGNOSIS — C3491 Malignant neoplasm of unspecified part of right bronchus or lung: Secondary | ICD-10-CM | POA: Diagnosis not present

## 2020-10-07 DIAGNOSIS — M5416 Radiculopathy, lumbar region: Secondary | ICD-10-CM | POA: Diagnosis not present

## 2020-10-07 DIAGNOSIS — M8458XD Pathological fracture in neoplastic disease, other specified site, subsequent encounter for fracture with routine healing: Secondary | ICD-10-CM | POA: Diagnosis not present

## 2020-10-07 DIAGNOSIS — C782 Secondary malignant neoplasm of pleura: Secondary | ICD-10-CM | POA: Diagnosis not present

## 2020-10-07 DIAGNOSIS — J910 Malignant pleural effusion: Secondary | ICD-10-CM | POA: Diagnosis not present

## 2020-10-07 DIAGNOSIS — C7951 Secondary malignant neoplasm of bone: Secondary | ICD-10-CM | POA: Diagnosis not present

## 2020-10-09 ENCOUNTER — Other Ambulatory Visit: Payer: Self-pay | Admitting: Oncology

## 2020-10-09 MED ORDER — FENTANYL 12 MCG/HR TD PT72: 1.0000 | MEDICATED_PATCH | TRANSDERMAL | 0 refills | Status: AC

## 2020-10-14 ENCOUNTER — Encounter: Payer: Self-pay | Admitting: Oncology

## 2020-10-14 ENCOUNTER — Inpatient Hospital Stay (HOSPITAL_BASED_OUTPATIENT_CLINIC_OR_DEPARTMENT_OTHER): Payer: Medicare HMO | Admitting: Oncology

## 2020-10-14 ENCOUNTER — Inpatient Hospital Stay: Payer: Medicare HMO

## 2020-10-14 ENCOUNTER — Inpatient Hospital Stay (HOSPITAL_BASED_OUTPATIENT_CLINIC_OR_DEPARTMENT_OTHER): Payer: Medicare HMO | Admitting: Hospice and Palliative Medicine

## 2020-10-14 VITALS — BP 129/86 | HR 125 | Temp 95.9°F | Resp 18 | Wt 139.4 lb

## 2020-10-14 DIAGNOSIS — Z515 Encounter for palliative care: Secondary | ICD-10-CM | POA: Diagnosis not present

## 2020-10-14 DIAGNOSIS — R432 Parageusia: Secondary | ICD-10-CM | POA: Diagnosis not present

## 2020-10-14 DIAGNOSIS — R5383 Other fatigue: Secondary | ICD-10-CM | POA: Diagnosis not present

## 2020-10-14 DIAGNOSIS — Z79899 Other long term (current) drug therapy: Secondary | ICD-10-CM | POA: Diagnosis not present

## 2020-10-14 DIAGNOSIS — Z5111 Encounter for antineoplastic chemotherapy: Secondary | ICD-10-CM | POA: Diagnosis not present

## 2020-10-14 DIAGNOSIS — C3491 Malignant neoplasm of unspecified part of right bronchus or lung: Secondary | ICD-10-CM

## 2020-10-14 DIAGNOSIS — C7951 Secondary malignant neoplasm of bone: Secondary | ICD-10-CM

## 2020-10-14 DIAGNOSIS — Z79891 Long term (current) use of opiate analgesic: Secondary | ICD-10-CM

## 2020-10-14 DIAGNOSIS — K5903 Drug induced constipation: Secondary | ICD-10-CM | POA: Diagnosis not present

## 2020-10-14 DIAGNOSIS — Z7189 Other specified counseling: Secondary | ICD-10-CM

## 2020-10-14 DIAGNOSIS — G893 Neoplasm related pain (acute) (chronic): Secondary | ICD-10-CM | POA: Diagnosis not present

## 2020-10-14 DIAGNOSIS — C782 Secondary malignant neoplasm of pleura: Secondary | ICD-10-CM | POA: Diagnosis not present

## 2020-10-14 DIAGNOSIS — R531 Weakness: Secondary | ICD-10-CM | POA: Diagnosis not present

## 2020-10-14 DIAGNOSIS — D696 Thrombocytopenia, unspecified: Secondary | ICD-10-CM

## 2020-10-14 DIAGNOSIS — I10 Essential (primary) hypertension: Secondary | ICD-10-CM | POA: Diagnosis not present

## 2020-10-14 LAB — CBC WITH DIFFERENTIAL/PLATELET
Abs Immature Granulocytes: 0.02 10*3/uL (ref 0.00–0.07)
Basophils Absolute: 0 10*3/uL (ref 0.0–0.1)
Basophils Relative: 0 %
Eosinophils Absolute: 0 10*3/uL (ref 0.0–0.5)
Eosinophils Relative: 0 %
HCT: 38.3 % — ABNORMAL LOW (ref 39.0–52.0)
Hemoglobin: 12 g/dL — ABNORMAL LOW (ref 13.0–17.0)
Immature Granulocytes: 0 %
Lymphocytes Relative: 3 %
Lymphs Abs: 0.2 10*3/uL — ABNORMAL LOW (ref 0.7–4.0)
MCH: 30.6 pg (ref 26.0–34.0)
MCHC: 31.3 g/dL (ref 30.0–36.0)
MCV: 97.7 fL (ref 80.0–100.0)
Monocytes Absolute: 0 10*3/uL — ABNORMAL LOW (ref 0.1–1.0)
Monocytes Relative: 0 %
Neutro Abs: 6.7 10*3/uL (ref 1.7–7.7)
Neutrophils Relative %: 97 %
Platelets: 99 10*3/uL — ABNORMAL LOW (ref 150–400)
RBC: 3.92 MIL/uL — ABNORMAL LOW (ref 4.22–5.81)
RDW: 17.6 % — ABNORMAL HIGH (ref 11.5–15.5)
WBC: 7 10*3/uL (ref 4.0–10.5)
nRBC: 0 % (ref 0.0–0.2)

## 2020-10-14 LAB — COMPREHENSIVE METABOLIC PANEL
ALT: 24 U/L (ref 0–44)
AST: 29 U/L (ref 15–41)
Albumin: 3.1 g/dL — ABNORMAL LOW (ref 3.5–5.0)
Alkaline Phosphatase: 72 U/L (ref 38–126)
Anion gap: 10 (ref 5–15)
BUN: 14 mg/dL (ref 8–23)
CO2: 26 mmol/L (ref 22–32)
Calcium: 9 mg/dL (ref 8.9–10.3)
Chloride: 100 mmol/L (ref 98–111)
Creatinine, Ser: 0.73 mg/dL (ref 0.61–1.24)
GFR, Estimated: >60 mL/min (ref >60)
Glucose, Bld: 226 mg/dL — ABNORMAL HIGH (ref 70–99)
Potassium: 3.6 mmol/L (ref 3.5–5.1)
Sodium: 136 mmol/L (ref 135–145)
Total Bilirubin: 0.9 mg/dL (ref 0.3–1.2)
Total Protein: 7.4 g/dL (ref 6.5–8.1)

## 2020-10-14 MED ORDER — SODIUM CHLORIDE 0.9 % IV SOLN
Freq: Once | INTRAVENOUS | Status: AC
  Filled 2020-10-14: qty 250

## 2020-10-14 MED ORDER — SODIUM CHLORIDE 0.9% FLUSH
10.0000 mL | INTRAVENOUS | Status: DC | PRN
  Administered 2020-10-14: 10 mL via INTRAVENOUS
  Filled 2020-10-14: qty 10

## 2020-10-14 MED ORDER — SODIUM CHLORIDE 0.9 % IV SOLN
10.0000 mg | Freq: Once | INTRAVENOUS | Status: AC
  Administered 2020-10-14: 10 mg via INTRAVENOUS
  Filled 2020-10-14: qty 10

## 2020-10-14 MED ORDER — HEPARIN SOD (PORK) LOCK FLUSH 100 UNIT/ML IV SOLN
500.0000 [IU] | Freq: Once | INTRAVENOUS | Status: AC
  Administered 2020-10-14: 500 [IU] via INTRAVENOUS
  Filled 2020-10-14: qty 5
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
## 2020-10-14 NOTE — Progress Notes (Addendum)
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 spinal stenosis as above. Electronically Signed   By: Logan Bores M.D.   On: 09/15/2020 14:56   CT OUTSIDE FILMS EXTREMITY  Result Date: 09/22/2020 This examination belongs to an outside facility and is stored here for comparison purposes only.  Contact the originating outside institution for any associated report or interpretation.    Assessment and plan- Patient is a 75 y.o. 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  He is here for on treatment assessment prior to cycle 1 of CarboTaxol Avastin chemotherapy  Patient's platelet count is 99 today.  He has not received any recent chemotherapy.  He has never had any low platelet counts before.  Unclear if it is related to radiation treatment which she completed recently.  I will hold off on giving him chemotherapy today and he will return in 1 week with repeat CBC with differential and CMP and if platelet counts are on the rise he can proceed with CarboTaxol Avastin chemotherapy.  Guardant 360 peripheral blood  NGS testing to be sent out today  Patient's performance has declined significantly after his recent cord compression.  He is now limited to moving around the house with a walker and has lost significant weight.  He will be seeing palliative care today as well to discuss further goals of care  Neoplasm related pain: Continue fentanyl patch and as needed oxycodone.  He is also on Lyrica  Bone metastases: We also discussed the need for Zometa to be given on a monthly basis to reduce the risk of future skeletal related fractures given the presence of bone metastases.  Discussed risks and benefits of Zometa including all but not limited to hypocalcemia and risk of osteonecrosis of the jaw.  Ideally patient needs dental clearance prior to starting Zometa but he is unable to do that at this time.  Patient understands risks and benefits and is willing to proceed with Zometa at this time which we will also start in 1 week's time    Visit Diagnosis 1. Encounter for antineoplastic chemotherapy   2. Thrombocytopenia ([place])   3. Goals of care, counseling/discussion   4. Stage IV adenocarcinoma of lung, right ([place])   5. Bone metastases ([place])      Dr. Randa Evens, MD, MPH [facility] at [facility] [phone] 10/14/2020 12:00 PM

## 2020-10-14 NOTE — Patient Instructions (Signed)
Dehydration, Adult Dehydration is a condition in which there is not enough water or other fluids in the body. This happens when a person loses more fluids than he or she takes in. Important organs, such as the kidneys, brain, and heart, cannot function without a proper amount of fluids. Any loss of fluids from the body can lead to dehydration. Dehydration can be mild, moderate, or severe. It should be treated right away to prevent it from becoming severe. What are the causes? Dehydration may be caused by:  Conditions that cause loss of water or other fluids, such as diarrhea, vomiting, or sweating or urinating a lot.  Not drinking enough fluids, especially when you are ill or doing activities that require a lot of energy.  Other illnesses and conditions, such as fever or infection.  Certain medicines, such as medicines that remove excess fluid from the body (diuretics).  Lack of safe drinking water.  Not being able to get enough water and food. What increases the risk? The following factors may make you more likely to develop this condition:  Having a long-term (chronic) illness that has not been treated properly, such as diabetes, heart disease, or kidney disease.  Being 65 years of age or older.  Having a disability.  Living in a place that is high in altitude, where thinner, drier air causes more fluid loss.  Doing exercises that put stress on your body for a long time (endurance sports). What are the signs or symptoms? Symptoms of dehydration depend on how severe it is. Mild or moderate dehydration  Thirst.  Dry lips or dry mouth.  Dizziness or light-headedness, especially when standing up from a seated position.  Muscle cramps.  Dark urine. Urine may be the color of tea.  Less urine or tears produced than usual.  Headache. Severe dehydration  Changes in skin. Your skin may be cold and clammy, blotchy, or pale. Your skin also may not return to normal after being  lightly pinched and released.  Little or no tears, urine, or sweat.  Changes in vital signs, such as rapid breathing and low blood pressure. Your pulse may be weak or may be faster than 100 beats a minute when you are sitting still.  Other changes, such as: ? Feeling very thirsty. ? Sunken eyes. ? Cold hands and feet. ? Confusion. ? Being very tired (lethargic) or having trouble waking from sleep. ? Short-term weight loss. ? Loss of consciousness. How is this diagnosed? This condition is diagnosed based on your symptoms and a physical exam. You may have blood and urine tests to help confirm the diagnosis. How is this treated? Treatment for this condition depends on how severe it is. Treatment should be started right away. Do not wait until dehydration becomes severe. Severe dehydration is an emergency and needs to be treated in a hospital.  Mild or moderate dehydration can be treated at home. You may be asked to: ? Drink more fluids. ? Drink an oral rehydration solution (ORS). This drink helps restore proper amounts of fluids and salts and minerals in the blood (electrolytes).  Severe dehydration can be treated: ? With IV fluids. ? By correcting abnormal levels of electrolytes. This is often done by giving electrolytes through a tube that is passed through your nose and into your stomach (nasogastric tube, or NG tube). ? By treating the underlying cause of dehydration. Follow these instructions at home: Oral rehydration solution If told by your health care provider, drink an ORS:  Make   an ORS by following instructions on the package.  Start by drinking small amounts, about  cup (120 mL) every 5-10 minutes.  Slowly increase how much you drink until you have taken the amount recommended by your health care provider. Eating and drinking  Drink enough clear fluid to keep your urine pale yellow. If you were told to drink an ORS, finish the ORS first and then start slowly drinking  other clear fluids. Drink fluids such as: ? Water. Do not drink only water. Doing that can lead to hyponatremia, which is having too little salt (sodium) in the body. ? Water from ice chips you suck on. ? Fruit juice that you have added water to (diluted fruit juice). ? Low-calorie sports drinks.  Eat foods that contain a healthy balance of electrolytes, such as bananas, oranges, potatoes, tomatoes, and spinach.  Do not drink alcohol.  Avoid the following: ? Drinks that contain a lot of sugar. These include high-calorie sports drinks, fruit juice that is not diluted, and soda. ? Caffeine. ? Foods that are greasy or contain a lot of fat or sugar.         General instructions  Take over-the-counter and prescription medicines only as told by your health care provider.  Do not take sodium tablets. Doing that can lead to having too much sodium in the body (hypernatremia).  Return to your normal activities as told by your health care provider. Ask your health care provider what activities are safe for you.  Keep all follow-up visits as told by your health care provider. This is important. Contact a health care provider if:  You have muscle cramps, pain, or discomfort, such as: ? Pain in your abdomen and the pain gets worse or stays in one area (localizes). ? Stiff neck.  You have a rash.  You are more irritable than usual.  You are sleepier or have a harder time waking than usual.  You feel weak or dizzy.  You feel very thirsty. Get help right away if you have:  Any symptoms of severe dehydration.  Symptoms of vomiting, such as: ? You cannot eat or drink without vomiting. ? Vomiting gets worse or does not go away. ? Vomit includes blood or green matter (bile).  Symptoms that get worse with treatment.  A fever.  A severe headache.  Problems with urination or bowel movements, such as: ? Diarrhea that gets worse or does not go away. ? Blood in your stool (feces).  This may cause stool to look black and tarry. ? Not urinating, or urinating only a small amount of very dark urine, within 6-8 hours.  Trouble breathing. These symptoms may represent a serious problem that is an emergency. Do not wait to see if the symptoms will go away. Get medical help right away. Call your local emergency services (911 in the [place] Do not drive yourself to the hospital. Summary  Dehydration is a condition in which there is not enough water or other fluids in the body. This happens when a person loses more fluids than he or she takes in.  Treatment for this condition depends on how severe it is. Treatment should be started right away. Do not wait until dehydration becomes severe.  Drink enough clear fluid to keep your urine pale yellow. If you were told to drink an oral rehydration solution (ORS), finish the ORS first and then start slowly drinking other clear fluids.  Take over-the-counter and prescription medicines only as told by your health   care provider.  Get help right away if you have any symptoms of severe dehydration. This information is not intended to replace advice given to you by your health care provider. Make sure you discuss any questions you have with your health care provider. Document Revised: 01/16/2019 Document Reviewed: 01/16/2019 [employer] Patient Education  2021 [employer]  

## 2020-10-18 ENCOUNTER — Other Ambulatory Visit: Payer: Self-pay | Admitting: Oncology

## 2020-10-20 DIAGNOSIS — C3491 Malignant neoplasm of unspecified part of right bronchus or lung: Secondary | ICD-10-CM | POA: Diagnosis not present

## 2020-10-21 ENCOUNTER — Inpatient Hospital Stay: Payer: Medicare HMO

## 2020-10-21 ENCOUNTER — Other Ambulatory Visit: Payer: Self-pay

## 2020-10-21 ENCOUNTER — Encounter: Payer: Self-pay | Admitting: Oncology

## 2020-10-21 ENCOUNTER — Inpatient Hospital Stay: Payer: Medicare HMO | Attending: Oncology

## 2020-10-21 VITALS — BP 98/56 | HR 85 | Temp 95.0°F | Resp 17 | Wt 137.0 lb

## 2020-10-21 DIAGNOSIS — Z5112 Encounter for antineoplastic immunotherapy: Secondary | ICD-10-CM | POA: Diagnosis not present

## 2020-10-21 DIAGNOSIS — C782 Secondary malignant neoplasm of pleura: Secondary | ICD-10-CM | POA: Diagnosis not present

## 2020-10-21 DIAGNOSIS — R000 Tachycardia, unspecified: Secondary | ICD-10-CM | POA: Insufficient documentation

## 2020-10-21 DIAGNOSIS — E876 Hypokalemia: Secondary | ICD-10-CM | POA: Insufficient documentation

## 2020-10-21 DIAGNOSIS — Z79899 Other long term (current) drug therapy: Secondary | ICD-10-CM | POA: Insufficient documentation

## 2020-10-21 DIAGNOSIS — Z7982 Long term (current) use of aspirin: Secondary | ICD-10-CM | POA: Diagnosis not present

## 2020-10-21 DIAGNOSIS — Z5111 Encounter for antineoplastic chemotherapy: Secondary | ICD-10-CM | POA: Diagnosis not present

## 2020-10-21 DIAGNOSIS — Z5189 Encounter for other specified aftercare: Secondary | ICD-10-CM | POA: Insufficient documentation

## 2020-10-21 DIAGNOSIS — R5381 Other malaise: Secondary | ICD-10-CM | POA: Insufficient documentation

## 2020-10-21 DIAGNOSIS — R5383 Other fatigue: Secondary | ICD-10-CM | POA: Diagnosis not present

## 2020-10-21 DIAGNOSIS — G992 Myelopathy in diseases classified elsewhere: Secondary | ICD-10-CM | POA: Diagnosis not present

## 2020-10-21 DIAGNOSIS — J910 Malignant pleural effusion: Secondary | ICD-10-CM | POA: Diagnosis not present

## 2020-10-21 DIAGNOSIS — M8458XD Pathological fracture in neoplastic disease, other specified site, subsequent encounter for fracture with routine healing: Secondary | ICD-10-CM | POA: Diagnosis not present

## 2020-10-21 DIAGNOSIS — C3491 Malignant neoplasm of unspecified part of right bronchus or lung: Secondary | ICD-10-CM | POA: Diagnosis not present

## 2020-10-21 DIAGNOSIS — C3431 Malignant neoplasm of lower lobe, right bronchus or lung: Secondary | ICD-10-CM | POA: Diagnosis not present

## 2020-10-21 DIAGNOSIS — D509 Iron deficiency anemia, unspecified: Secondary | ICD-10-CM | POA: Insufficient documentation

## 2020-10-21 DIAGNOSIS — C7951 Secondary malignant neoplasm of bone: Secondary | ICD-10-CM | POA: Diagnosis not present

## 2020-10-21 DIAGNOSIS — M5416 Radiculopathy, lumbar region: Secondary | ICD-10-CM | POA: Diagnosis not present

## 2020-10-21 DIAGNOSIS — E860 Dehydration: Secondary | ICD-10-CM | POA: Diagnosis not present

## 2020-10-21 DIAGNOSIS — G893 Neoplasm related pain (acute) (chronic): Secondary | ICD-10-CM | POA: Diagnosis not present

## 2020-10-21 LAB — COMPREHENSIVE METABOLIC PANEL
ALT: 36 U/L (ref 0–44)
AST: 39 U/L (ref 15–41)
Albumin: 3.1 g/dL — ABNORMAL LOW (ref 3.5–5.0)
Alkaline Phosphatase: 78 U/L (ref 38–126)
Anion gap: 10 (ref 5–15)
BUN: 10 mg/dL (ref 8–23)
CO2: 27 mmol/L (ref 22–32)
Calcium: 8.7 mg/dL — ABNORMAL LOW (ref 8.9–10.3)
Chloride: 98 mmol/L (ref 98–111)
Creatinine, Ser: 0.67 mg/dL (ref 0.61–1.24)
GFR, Estimated: >60 mL/min (ref >60)
Glucose, Bld: 153 mg/dL — ABNORMAL HIGH (ref 70–99)
Potassium: 3.4 mmol/L — ABNORMAL LOW (ref 3.5–5.1)
Sodium: 135 mmol/L (ref 135–145)
Total Bilirubin: 0.9 mg/dL (ref 0.3–1.2)
Total Protein: 6.8 g/dL (ref 6.5–8.1)

## 2020-10-21 LAB — CBC WITH DIFFERENTIAL/PLATELET
Abs Immature Granulocytes: 0.04 10*3/uL (ref 0.00–0.07)
Basophils Absolute: 0 10*3/uL (ref 0.0–0.1)
Basophils Relative: 0 %
Eosinophils Absolute: 0 10*3/uL (ref 0.0–0.5)
Eosinophils Relative: 0 %
HCT: 34.9 % — ABNORMAL LOW (ref 39.0–52.0)
Hemoglobin: 11 g/dL — ABNORMAL LOW (ref 13.0–17.0)
Immature Granulocytes: 1 %
Lymphocytes Relative: 9 %
Lymphs Abs: 0.8 10*3/uL (ref 0.7–4.0)
MCH: 30.6 pg (ref 26.0–34.0)
MCHC: 31.5 g/dL (ref 30.0–36.0)
MCV: 97.2 fL (ref 80.0–100.0)
Monocytes Absolute: 0.1 10*3/uL (ref 0.1–1.0)
Monocytes Relative: 1 %
Neutro Abs: 7.7 10*3/uL (ref 1.7–7.7)
Neutrophils Relative %: 89 %
Platelets: 201 10*3/uL (ref 150–400)
RBC: 3.59 MIL/uL — ABNORMAL LOW (ref 4.22–5.81)
RDW: 17.1 % — ABNORMAL HIGH (ref 11.5–15.5)
Smear Review: NORMAL
WBC: 8.6 10*3/uL (ref 4.0–10.5)
nRBC: 0 % (ref 0.0–0.2)

## 2020-10-21 LAB — URINALYSIS, DIPSTICK ONLY
Bilirubin Urine: NEGATIVE
Glucose, UA: NEGATIVE mg/dL
Hgb urine dipstick: NEGATIVE
Ketones, ur: NEGATIVE mg/dL
Leukocytes,Ua: NEGATIVE
Nitrite: NEGATIVE
Protein, ur: NEGATIVE mg/dL
Specific Gravity, Urine: 1.014 (ref 1.005–1.030)
pH: 6 (ref 5.0–8.0)

## 2020-10-21 MED ORDER — HEPARIN SOD (PORK) LOCK FLUSH 100 UNIT/ML IV SOLN
500.0000 [IU] | Freq: Once | INTRAVENOUS | Status: AC
  Administered 2020-10-21: 500 [IU] via INTRAVENOUS
  Filled 2020-10-21: qty 5

## 2020-10-21 MED ORDER — SODIUM CHLORIDE 0.9% FLUSH
10.0000 mL | INTRAVENOUS | Status: DC | PRN
  Administered 2020-10-21: 10 mL via INTRAVENOUS
  Filled 2020-10-21: qty 10

## 2020-10-21 MED ORDER — SODIUM CHLORIDE 0.9 % IV SOLN
280.0000 mg | Freq: Once | INTRAVENOUS | Status: AC
  Administered 2020-10-21: 282 mg via INTRAVENOUS
  Filled 2020-10-21: qty 47

## 2020-10-21 MED ORDER — HEPARIN SOD (PORK) LOCK FLUSH 100 UNIT/ML IV SOLN
500.0000 [IU] | Freq: Once | INTRAVENOUS | Status: DC | PRN
  Filled 2020-10-21: qty 5

## 2020-10-21 MED ORDER — PEGFILGRASTIM 6 MG/0.6ML ~~LOC~~ PSKT
6.0000 mg | PREFILLED_SYRINGE | Freq: Once | SUBCUTANEOUS | Status: AC
  Administered 2020-10-21: 6 mg via SUBCUTANEOUS
  Filled 2020-10-21: qty 0.6

## 2020-10-21 MED ORDER — SODIUM CHLORIDE 0.9 % IV SOLN
10.0000 mg | Freq: Once | INTRAVENOUS | Status: AC
  Administered 2020-10-21: 10 mg via INTRAVENOUS
  Filled 2020-10-21: qty 10

## 2020-10-21 MED ORDER — SODIUM CHLORIDE 0.9 % IV SOLN
410.0000 mg | Freq: Once | INTRAVENOUS | Status: AC
  Administered 2020-10-21: 410 mg via INTRAVENOUS
  Filled 2020-10-21: qty 41

## 2020-10-21 MED ORDER — SODIUM CHLORIDE 0.9 % IV SOLN
Freq: Once | INTRAVENOUS | Status: AC
Start: 2020-10-21 — End: 2020-10-21
  Filled 2020-10-21: qty 250

## 2020-10-21 MED ORDER — DIPHENHYDRAMINE HCL 50 MG/ML IJ SOLN
50.0000 mg | Freq: Once | INTRAMUSCULAR | Status: AC
Start: 2020-10-21 — End: 2020-10-21
  Administered 2020-10-21: 50 mg via INTRAVENOUS
  Filled 2020-10-21: qty 1

## 2020-10-21 MED ORDER — FAMOTIDINE 20 MG IN NS 100 ML IVPB
20.0000 mg | Freq: Once | INTRAVENOUS | Status: AC
  Administered 2020-10-21: 20 mg via INTRAVENOUS
  Filled 2020-10-21: qty 20

## 2020-10-21 MED ORDER — PALONOSETRON HCL INJECTION 0.25 MG/5ML
0.2500 mg | Freq: Once | INTRAVENOUS | Status: AC
  Administered 2020-10-21: 0.25 mg via INTRAVENOUS
  Filled 2020-10-21: qty 5

## 2020-10-21 MED ORDER — SODIUM CHLORIDE 0.9 % IV SOLN
150.0000 mg | Freq: Once | INTRAVENOUS | Status: AC
  Administered 2020-10-21: 150 mg via INTRAVENOUS
  Filled 2020-10-21: qty 150

## 2020-10-21 MED ORDER — SODIUM CHLORIDE 0.9 % IV SOLN
1000.0000 mg | Freq: Once | INTRAVENOUS | Status: AC
  Administered 2020-10-21: 1000 mg via INTRAVENOUS
  Filled 2020-10-21: qty 32

## 2020-10-21 NOTE — Patient Instructions (Addendum)
[facility]  Discharge Instructions: Thank you for choosing [facility] to provide your oncology and hematology care.  If you have a lab appointment with the [facility], please go directly to the [facility] and check in at the registration area.  Wear comfortable clothing and clothing appropriate for easy access to any Portacath or PICC line.   We strive to give you quality time with your provider. You may need to reschedule your appointment if you arrive late (15 or more minutes).  Arriving late affects you and other patients whose appointments are after yours.  Also, if you miss three or more appointments without notifying the office, you may be dismissed from the clinic at the provider's discretion.      For prescription refill requests, have your pharmacy contact our office and allow 72 hours for refills to be completed.    Today you received the following chemotherapy and/or immunotherapy agents: Taxol, Carboplatin, and Bevacizumab      To help prevent nausea and vomiting after your treatment, we encourage you to take your nausea medication as directed.  BELOW ARE SYMPTOMS THAT SHOULD BE REPORTED IMMEDIATELY: . *FEVER GREATER THAN 100.4 F (38 C) OR HIGHER . *CHILLS OR SWEATING . *NAUSEA AND VOMITING THAT IS NOT CONTROLLED WITH YOUR NAUSEA MEDICATION . *UNUSUAL SHORTNESS OF BREATH . *UNUSUAL BRUISING OR BLEEDING . *URINARY PROBLEMS (pain or burning when urinating, or frequent urination) . *BOWEL PROBLEMS (unusual diarrhea, constipation, pain near the anus) . TENDERNESS IN MOUTH AND THROAT WITH OR WITHOUT PRESENCE OF ULCERS (sore throat, sores in mouth, or a toothache) . UNUSUAL RASH, SWELLING OR PAIN  . UNUSUAL VAGINAL DISCHARGE OR ITCHING   Items with * indicate a potential emergency and should be followed up as soon as possible or go to the Emergency Department if any problems should occur.  Please show the CHEMOTHERAPY ALERT  CARD or IMMUNOTHERAPY ALERT CARD at check-in to the Emergency Department and triage nurse.  Should you have questions after your visit or need to cancel or reschedule your appointment, please contact [facility]  [phone] and follow the prompts.  Office hours are 8:00 a.m. to 4:30 p.m. Monday - Friday. Please note that voicemails left after 4:00 p.m. may not be returned until the following business day.  We are closed weekends and major holidays. You have access to a nurse at all times for urgent questions. Please call the main number to the clinic [phone] and follow the prompts.  For any non-urgent questions, you may also contact your provider using MyChart. We now offer e-Visits for anyone 50 and older to request care online for non-urgent symptoms. For details visit [email or web address].   Also download the MyChart app! Go to the app store, search "MyChart", open the app, select [facility], and log in with your MyChart username and password.  Due to Covid, a mask is required upon entering the hospital/clinic. If you do not have a mask, one will be given to you upon arrival. For doctor visits, patients may have 1 support person aged 94 or older with them. For treatment visits, patients cannot have anyone with them due to current Covid guidelines and our immunocompromised population.       Paclitaxel injection What is this medicine? PACLITAXEL (PAK li TAX el) is a chemotherapy drug. It targets fast dividing cells, like cancer cells, and causes these cells to die. This medicine is used to treat ovarian cancer, breast  cancer, lung cancer, Kaposi's sarcoma, and other cancers. This medicine may be used for other purposes; ask your health care provider or pharmacist if you have questions. COMMON BRAND NAME(S): Onxol, Taxol What should I tell my health care provider before I take this medicine? They need to know if you have any of these  conditions:  history of irregular heartbeat  liver disease  low blood counts, like low white cell, platelet, or red cell counts  lung or breathing disease, like asthma  tingling of the fingers or toes, or other nerve disorder  an unusual or allergic reaction to paclitaxel, alcohol, polyoxyethylated castor oil, other chemotherapy, other medicines, foods, dyes, or preservatives  pregnant or trying to get pregnant  breast-feeding How should I use this medicine? This drug is given as an infusion into a vein. It is administered in a hospital or clinic by a specially trained health care professional. Talk to your pediatrician regarding the use of this medicine in children. Special care may be needed. Overdosage: If you think you have taken too much of this medicine contact a poison control center or emergency room at once. NOTE: This medicine is only for you. Do not share this medicine with others. What if I miss a dose? It is important not to miss your dose. Call your doctor or health care professional if you are unable to keep an appointment. What may interact with this medicine? Do not take this medicine with any of the following medications:  live virus vaccines This medicine may also interact with the following medications:  antiviral medicines for hepatitis, HIV or AIDS  certain antibiotics like erythromycin and clarithromycin  certain medicines for fungal infections like ketoconazole and itraconazole  certain medicines for seizures like carbamazepine, phenobarbital, phenytoin  gemfibrozil  nefazodone  rifampin  St. John's wort This list may not describe all possible interactions. Give your health care provider a list of all the medicines, herbs, non-prescription drugs, or dietary supplements you use. Also tell them if you smoke, drink alcohol, or use illegal drugs. Some items may interact with your medicine. What should I watch for while using this medicine? Your  condition will be monitored carefully while you are receiving this medicine. You will need important blood work done while you are taking this medicine. This medicine can cause serious allergic reactions. To reduce your risk you will need to take other medicine(s) before treatment with this medicine. If you experience allergic reactions like skin rash, itching or hives, swelling of the face, lips, or tongue, tell your doctor or health care professional right away. In some cases, you may be given additional medicines to help with side effects. Follow all directions for their use. This drug may make you feel generally unwell. This is not uncommon, as chemotherapy can affect healthy cells as well as cancer cells. Report any side effects. Continue your course of treatment even though you feel ill unless your doctor tells you to stop. Call your doctor or health care professional for advice if you get a fever, chills or sore throat, or other symptoms of a cold or flu. Do not treat yourself. This drug decreases your body's ability to fight infections. Try to avoid being around people who are sick. This medicine may increase your risk to bruise or bleed. Call your doctor or health care professional if you notice any unusual bleeding. Be careful brushing and flossing your teeth or using a toothpick because you may get an infection or bleed more easily. If  you have any dental work done, tell your dentist you are receiving this medicine. Avoid taking products that contain aspirin, acetaminophen, ibuprofen, naproxen, or ketoprofen unless instructed by your doctor. These medicines may hide a fever. Do not become pregnant while taking this medicine. Women should inform their doctor if they wish to become pregnant or think they might be pregnant. There is a potential for serious side effects to an unborn child. Talk to your health care professional or pharmacist for more information. Do not breast-feed an infant while  taking this medicine. Men are advised not to father a child while receiving this medicine. This product may contain alcohol. Ask your pharmacist or healthcare provider if this medicine contains alcohol. Be sure to tell all healthcare providers you are taking this medicine. Certain medicines, like metronidazole and disulfiram, can cause an unpleasant reaction when taken with alcohol. The reaction includes flushing, headache, nausea, vomiting, sweating, and increased thirst. The reaction can last from 30 minutes to several hours. What side effects may I notice from receiving this medicine? Side effects that you should report to your doctor or health care professional as soon as possible:  allergic reactions like skin rash, itching or hives, swelling of the face, lips, or tongue  breathing problems  changes in vision  fast, irregular heartbeat  high or low blood pressure  mouth sores  pain, tingling, numbness in the hands or feet  signs of decreased platelets or bleeding - bruising, pinpoint red spots on the skin, black, tarry stools, blood in the urine  signs of decreased red blood cells - unusually weak or tired, feeling faint or lightheaded, falls  signs of infection - fever or chills, cough, sore throat, pain or difficulty passing urine  signs and symptoms of liver injury like dark yellow or brown urine; general ill feeling or flu-like symptoms; light-colored stools; loss of appetite; nausea; right upper belly pain; unusually weak or tired; yellowing of the eyes or skin  swelling of the ankles, feet, hands  unusually slow heartbeat Side effects that usually do not require medical attention (report to your doctor or health care professional if they continue or are bothersome):  diarrhea  hair loss  loss of appetite  muscle or joint pain  nausea, vomiting  pain, redness, or irritation at site where injected  tiredness This list may not describe all possible side effects.  Call your doctor for medical advice about side effects. You may report side effects to FDA at 1-800-FDA-1088. Where should I keep my medicine? This drug is given in a hospital or clinic and will not be stored at home. NOTE: This sheet is a summary. It may not cover all possible information. If you have questions about this medicine, talk to your doctor, pharmacist, or health care provider.  2021 Elsevier/Gold Standard (2019-05-07 13:37:23)    Carboplatin injection What is this medicine? CARBOPLATIN (KAR boe pla tin) is a chemotherapy drug. It targets fast dividing cells, like cancer cells, and causes these cells to die. This medicine is used to treat ovarian cancer and many other cancers. This medicine may be used for other purposes; ask your health care provider or pharmacist if you have questions. COMMON BRAND NAME(S): Paraplatin What should I tell my health care provider before I take this medicine? They need to know if you have any of these conditions:  blood disorders  hearing problems  kidney disease  recent or ongoing radiation therapy  an unusual or allergic reaction to carboplatin, cisplatin, other chemotherapy,  other medicines, foods, dyes, or preservatives  pregnant or trying to get pregnant  breast-feeding How should I use this medicine? This drug is usually given as an infusion into a vein. It is administered in a hospital or clinic by a specially trained health care professional. Talk to your pediatrician regarding the use of this medicine in children. Special care may be needed. Overdosage: If you think you have taken too much of this medicine contact a poison control center or emergency room at once. NOTE: This medicine is only for you. Do not share this medicine with others. What if I miss a dose? It is important not to miss a dose. Call your doctor or health care professional if you are unable to keep an appointment. What may interact with this  medicine?  medicines for seizures  medicines to increase blood counts like filgrastim, pegfilgrastim, sargramostim  some antibiotics like amikacin, gentamicin, neomycin, streptomycin, tobramycin  vaccines Talk to your doctor or health care professional before taking any of these medicines:  acetaminophen  aspirin  ibuprofen  ketoprofen  naproxen This list may not describe all possible interactions. Give your health care provider a list of all the medicines, herbs, non-prescription drugs, or dietary supplements you use. Also tell them if you smoke, drink alcohol, or use illegal drugs. Some items may interact with your medicine. What should I watch for while using this medicine? Your condition will be monitored carefully while you are receiving this medicine. You will need important blood work done while you are taking this medicine. This drug may make you feel generally unwell. This is not uncommon, as chemotherapy can affect healthy cells as well as cancer cells. Report any side effects. Continue your course of treatment even though you feel ill unless your doctor tells you to stop. In some cases, you may be given additional medicines to help with side effects. Follow all directions for their use. Call your doctor or health care professional for advice if you get a fever, chills or sore throat, or other symptoms of a cold or flu. Do not treat yourself. This drug decreases your body's ability to fight infections. Try to avoid being around people who are sick. This medicine may increase your risk to bruise or bleed. Call your doctor or health care professional if you notice any unusual bleeding. Be careful brushing and flossing your teeth or using a toothpick because you may get an infection or bleed more easily. If you have any dental work done, tell your dentist you are receiving this medicine. Avoid taking products that contain aspirin, acetaminophen, ibuprofen, naproxen, or ketoprofen  unless instructed by your doctor. These medicines may hide a fever. Do not become pregnant while taking this medicine. Women should inform their doctor if they wish to become pregnant or think they might be pregnant. There is a potential for serious side effects to an unborn child. Talk to your health care professional or pharmacist for more information. Do not breast-feed an infant while taking this medicine. What side effects may I notice from receiving this medicine? Side effects that you should report to your doctor or health care professional as soon as possible:  allergic reactions like skin rash, itching or hives, swelling of the face, lips, or tongue  signs of infection - fever or chills, cough, sore throat, pain or difficulty passing urine  signs of decreased platelets or bleeding - bruising, pinpoint red spots on the skin, black, tarry stools, nosebleeds  signs of decreased red blood  cells - unusually weak or tired, fainting spells, lightheadedness  breathing problems  changes in hearing  changes in vision  chest pain  high blood pressure  low blood counts - This drug may decrease the number of white blood cells, red blood cells and platelets. You may be at increased risk for infections and bleeding.  nausea and vomiting  pain, swelling, redness or irritation at the injection site  pain, tingling, numbness in the hands or feet  problems with balance, talking, walking  trouble passing urine or change in the amount of urine Side effects that usually do not require medical attention (report to your doctor or health care professional if they continue or are bothersome):  hair loss  loss of appetite  metallic taste in the mouth or changes in taste This list may not describe all possible side effects. Call your doctor for medical advice about side effects. You may report side effects to FDA at 1-800-FDA-1088. Where should I keep my medicine? This drug is given in a  hospital or clinic and will not be stored at home. NOTE: This sheet is a summary. It may not cover all possible information. If you have questions about this medicine, talk to your doctor, pharmacist, or health care provider.  2021 Elsevier/Gold Standard (2007-09-10 14:38:05)   Bevacizumab injection What is this medicine? BEVACIZUMAB (be va SIZ yoo mab) is a monoclonal antibody. It is used to treat many types of cancer. This medicine may be used for other purposes; ask your health care provider or pharmacist if you have questions. COMMON BRAND NAME(S): Avastin, MVASI, Zirabev What should I tell my health care provider before I take this medicine? They need to know if you have any of these conditions:  diabetes  heart disease  high blood pressure  history of coughing up blood  prior anthracycline chemotherapy (e.g., doxorubicin, daunorubicin, epirubicin)  recent or ongoing radiation therapy  recent or planning to have surgery  stroke  an unusual or allergic reaction to bevacizumab, hamster proteins, mouse proteins, other medicines, foods, dyes, or preservatives  pregnant or trying to get pregnant  breast-feeding How should I use this medicine? This medicine is for infusion into a vein. It is given by a health care professional in a hospital or clinic setting. Talk to your pediatrician regarding the use of this medicine in children. Special care may be needed. Overdosage: If you think you have taken too much of this medicine contact a poison control center or emergency room at once. NOTE: This medicine is only for you. Do not share this medicine with others. What if I miss a dose? It is important not to miss your dose. Call your doctor or health care professional if you are unable to keep an appointment. What may interact with this medicine? Interactions are not expected. This list may not describe all possible interactions. Give your health care provider a list of all the  medicines, herbs, non-prescription drugs, or dietary supplements you use. Also tell them if you smoke, drink alcohol, or use illegal drugs. Some items may interact with your medicine. What should I watch for while using this medicine? Your condition will be monitored carefully while you are receiving this medicine. You will need important blood work and urine testing done while you are taking this medicine. This medicine may increase your risk to bruise or bleed. Call your doctor or health care professional if you notice any unusual bleeding. Before having surgery, talk to your health care provider to  make sure it is ok. This drug can increase the risk of poor healing of your surgical site or wound. You will need to stop this drug for 28 days before surgery. After surgery, wait at least 28 days before restarting this drug. Make sure the surgical site or wound is healed enough before restarting this drug. Talk to your health care provider if questions. Do not become pregnant while taking this medicine or for 6 months after stopping it. Women should inform their doctor if they wish to become pregnant or think they might be pregnant. There is a potential for serious side effects to an unborn child. Talk to your health care professional or pharmacist for more information. Do not breast-feed an infant while taking this medicine and for 6 months after the last dose. This medicine has caused ovarian failure in some women. This medicine may interfere with the ability to have a child. You should talk to your doctor or health care professional if you are concerned about your fertility. What side effects may I notice from receiving this medicine? Side effects that you should report to your doctor or health care professional as soon as possible:  allergic reactions like skin rash, itching or hives, swelling of the face, lips, or tongue  chest pain or chest tightness  chills  coughing up blood  high  fever  seizures  severe constipation  signs and symptoms of bleeding such as bloody or black, tarry stools; red or dark-brown urine; spitting up blood or brown material that looks like coffee grounds; red spots on the skin; unusual bruising or bleeding from the eye, gums, or nose  signs and symptoms of a blood clot such as breathing problems; chest pain; severe, sudden headache; pain, swelling, warmth in the leg  signs and symptoms of a stroke like changes in vision; confusion; trouble speaking or understanding; severe headaches; sudden numbness or weakness of the face, arm or leg; trouble walking; dizziness; loss of balance or coordination  stomach pain  sweating  swelling of legs or ankles  vomiting  weight gain Side effects that usually do not require medical attention (report to your doctor or health care professional if they continue or are bothersome):  back pain  changes in taste  decreased appetite  dry skin  nausea  tiredness This list may not describe all possible side effects. Call your doctor for medical advice about side effects. You may report side effects to FDA at 1-800-FDA-1088. Where should I keep my medicine? This drug is given in a hospital or clinic and will not be stored at home. NOTE: This sheet is a summary. It may not cover all possible information. If you have questions about this medicine, talk to your doctor, pharmacist, or health care provider.  2021 Elsevier/Gold Standard (2019-04-02 10:50:46)   Pegfilgrastim injection What is this medicine? PEGFILGRASTIM (PEG fil gra stim) is a long-acting granulocyte colony-stimulating factor that stimulates the growth of neutrophils, a type of white blood cell important in the body's fight against infection. It is used to reduce the incidence of fever and infection in patients with certain types of cancer who are receiving chemotherapy that affects the bone marrow, and to increase survival after being  exposed to high doses of radiation. This medicine may be used for other purposes; ask your health care provider or pharmacist if you have questions. COMMON BRAND NAME(S): Rexene Edison, Ziextenzo What should I tell my health care provider before I take this medicine? They need  to know if you have any of these conditions:  kidney disease  latex allergy  ongoing radiation therapy  sickle cell disease  skin reactions to acrylic adhesives (On-Body Injector only)  an unusual or allergic reaction to pegfilgrastim, filgrastim, other medicines, foods, dyes, or preservatives  pregnant or trying to get pregnant  breast-feeding How should I use this medicine? This medicine is for injection under the skin. If you get this medicine at home, you will be taught how to prepare and give the pre-filled syringe or how to use the On-body Injector. Refer to the patient Instructions for Use for detailed instructions. Use exactly as directed. Tell your healthcare provider immediately if you suspect that the On-body Injector may not have performed as intended or if you suspect the use of the On-body Injector resulted in a missed or partial dose. It is important that you put your used needles and syringes in a special sharps container. Do not put them in a trash can. If you do not have a sharps container, call your pharmacist or healthcare provider to get one. Talk to your pediatrician regarding the use of this medicine in children. While this drug may be prescribed for selected conditions, precautions do apply. Overdosage: If you think you have taken too much of this medicine contact a poison control center or emergency room at once. NOTE: This medicine is only for you. Do not share this medicine with others. What if I miss a dose? It is important not to miss your dose. Call your doctor or health care professional if you miss your dose. If you miss a dose due to an On-body Injector  failure or leakage, a new dose should be administered as soon as possible using a single prefilled syringe for manual use. What may interact with this medicine? Interactions have not been studied. This list may not describe all possible interactions. Give your health care provider a list of all the medicines, herbs, non-prescription drugs, or dietary supplements you use. Also tell them if you smoke, drink alcohol, or use illegal drugs. Some items may interact with your medicine. What should I watch for while using this medicine? Your condition will be monitored carefully while you are receiving this medicine. You may need blood work done while you are taking this medicine. Talk to your health care provider about your risk of cancer. You may be more at risk for certain types of cancer if you take this medicine. If you are going to need a MRI, CT scan, or other procedure, tell your doctor that you are using this medicine (On-Body Injector only). What side effects may I notice from receiving this medicine? Side effects that you should report to your doctor or health care professional as soon as possible:  allergic reactions (skin rash, itching or hives, swelling of the face, lips, or tongue)  back pain  dizziness  fever  pain, redness, or irritation at site where injected  pinpoint red spots on the skin  red or dark-brown urine  shortness of breath or breathing problems  stomach or side pain, or pain at the shoulder  swelling  tiredness  trouble passing urine or change in the amount of urine  unusual bruising or bleeding Side effects that usually do not require medical attention (report to your doctor or health care professional if they continue or are bothersome):  bone pain  muscle pain This list may not describe all possible side effects. Call your doctor for medical advice about side effects.  You may report side effects to FDA at 1-800-FDA-1088. Where should I keep my  medicine? Keep out of the reach of children. If you are using this medicine at home, you will be instructed on how to store it. Throw away any unused medicine after the expiration date on the label. NOTE: This sheet is a summary. It may not cover all possible information. If you have questions about this medicine, talk to your doctor, pharmacist, or health care provider.  2021 Elsevier/Gold Standard (2019-06-27 13:20:51)

## 2020-10-30 ENCOUNTER — Other Ambulatory Visit: Payer: Self-pay | Admitting: *Deleted

## 2020-10-30 DIAGNOSIS — C3491 Malignant neoplasm of unspecified part of right bronchus or lung: Secondary | ICD-10-CM

## 2020-11-01 ENCOUNTER — Inpatient Hospital Stay: Payer: Medicare HMO

## 2020-11-01 ENCOUNTER — Other Ambulatory Visit: Payer: Self-pay | Admitting: *Deleted

## 2020-11-01 ENCOUNTER — Other Ambulatory Visit: Payer: Self-pay

## 2020-11-01 ENCOUNTER — Encounter: Payer: Self-pay | Admitting: Oncology

## 2020-11-01 ENCOUNTER — Inpatient Hospital Stay: Payer: Medicare HMO | Admitting: Oncology

## 2020-11-01 VITALS — BP 113/74 | HR 78 | Temp 95.0°F | Resp 16

## 2020-11-01 DIAGNOSIS — C3491 Malignant neoplasm of unspecified part of right bronchus or lung: Secondary | ICD-10-CM

## 2020-11-01 DIAGNOSIS — E860 Dehydration: Secondary | ICD-10-CM | POA: Diagnosis not present

## 2020-11-01 DIAGNOSIS — G893 Neoplasm related pain (acute) (chronic): Secondary | ICD-10-CM

## 2020-11-01 DIAGNOSIS — E876 Hypokalemia: Secondary | ICD-10-CM | POA: Diagnosis not present

## 2020-11-01 DIAGNOSIS — R000 Tachycardia, unspecified: Secondary | ICD-10-CM | POA: Diagnosis not present

## 2020-11-01 DIAGNOSIS — D509 Iron deficiency anemia, unspecified: Secondary | ICD-10-CM | POA: Diagnosis not present

## 2020-11-01 DIAGNOSIS — Z5112 Encounter for antineoplastic immunotherapy: Secondary | ICD-10-CM | POA: Diagnosis not present

## 2020-11-01 DIAGNOSIS — Z5189 Encounter for other specified aftercare: Secondary | ICD-10-CM | POA: Diagnosis not present

## 2020-11-01 DIAGNOSIS — Z5111 Encounter for antineoplastic chemotherapy: Secondary | ICD-10-CM | POA: Diagnosis not present

## 2020-11-01 DIAGNOSIS — C3431 Malignant neoplasm of lower lobe, right bronchus or lung: Secondary | ICD-10-CM | POA: Diagnosis not present

## 2020-11-01 LAB — CBC WITH DIFFERENTIAL/PLATELET
Abs Immature Granulocytes: 0.06 10*3/uL (ref 0.00–0.07)
Basophils Absolute: 0.1 10*3/uL (ref 0.0–0.1)
Basophils Relative: 2 %
Eosinophils Absolute: 0 10*3/uL (ref 0.0–0.5)
Eosinophils Relative: 0 %
HCT: 33.9 % — ABNORMAL LOW (ref 39.0–52.0)
Hemoglobin: 10.9 g/dL — ABNORMAL LOW (ref 13.0–17.0)
Immature Granulocytes: 2 %
Lymphocytes Relative: 17 %
Lymphs Abs: 0.6 10*3/uL — ABNORMAL LOW (ref 0.7–4.0)
MCH: 30.5 pg (ref 26.0–34.0)
MCHC: 32.2 g/dL (ref 30.0–36.0)
MCV: 95 fL (ref 80.0–100.0)
Monocytes Absolute: 0.4 10*3/uL (ref 0.1–1.0)
Monocytes Relative: 14 %
Neutro Abs: 2.1 10*3/uL (ref 1.7–7.7)
Neutrophils Relative %: 65 %
Platelets: 167 10*3/uL (ref 150–400)
RBC: 3.57 MIL/uL — ABNORMAL LOW (ref 4.22–5.81)
RDW: 15.9 % — ABNORMAL HIGH (ref 11.5–15.5)
Smear Review: ADEQUATE
WBC: 3.2 10*3/uL — ABNORMAL LOW (ref 4.0–10.5)
nRBC: 0 % (ref 0.0–0.2)

## 2020-11-01 LAB — COMPREHENSIVE METABOLIC PANEL
ALT: 37 U/L (ref 0–44)
AST: 37 U/L (ref 15–41)
Albumin: 3.2 g/dL — ABNORMAL LOW (ref 3.5–5.0)
Alkaline Phosphatase: 67 U/L (ref 38–126)
Anion gap: 11 (ref 5–15)
BUN: 10 mg/dL (ref 8–23)
CO2: 26 mmol/L (ref 22–32)
Calcium: 8.3 mg/dL — ABNORMAL LOW (ref 8.9–10.3)
Chloride: 95 mmol/L — ABNORMAL LOW (ref 98–111)
Creatinine, Ser: 0.72 mg/dL (ref 0.61–1.24)
GFR, Estimated: >60 mL/min (ref >60)
Glucose, Bld: 106 mg/dL — ABNORMAL HIGH (ref 70–99)
Potassium: 2.5 mmol/L — CL (ref 3.5–5.1)
Sodium: 132 mmol/L — ABNORMAL LOW (ref 135–145)
Total Bilirubin: 0.9 mg/dL (ref 0.3–1.2)
Total Protein: 7 g/dL (ref 6.5–8.1)

## 2020-11-01 MED ORDER — POTASSIUM CHLORIDE 20 MEQ PO PACK: 20.0000 meq | PACK | Freq: Every day | ORAL | 0 refills | Status: DC

## 2020-11-01 MED ORDER — HEPARIN SOD (PORK) LOCK FLUSH 100 UNIT/ML IV SOLN
INTRAVENOUS | Status: AC
  Filled 2020-11-01: qty 5

## 2020-11-01 MED ORDER — SODIUM CHLORIDE 0.9 % IV SOLN
Freq: Once | INTRAVENOUS | Status: AC
Start: 2020-11-01 — End: 2020-11-01
  Filled 2020-11-01: qty 250

## 2020-11-01 MED ORDER — POTASSIUM CHLORIDE IN NACL 40-0.9 MEQ/L-% IV SOLN
Freq: Once | INTRAVENOUS | Status: AC
  Filled 2020-11-01: qty 1000

## 2020-11-01 MED ORDER — SODIUM CHLORIDE 0.9% FLUSH
10.0000 mL | INTRAVENOUS | Status: AC | PRN
Start: 2020-11-01 — End: ?
  Administered 2020-11-01: 10 mL via INTRAVENOUS
  Filled 2020-11-01: qty 10

## 2020-11-01 MED ORDER — PREGABALIN 75 MG PO CAPS: 75.0000 mg | ORAL_CAPSULE | Freq: Two times a day (BID) | ORAL | 1 refills | Status: AC

## 2020-11-01 MED ORDER — HEPARIN SOD (PORK) LOCK FLUSH 100 UNIT/ML IV SOLN
500.0000 [IU] | Freq: Once | INTRAVENOUS | Status: AC
  Administered 2020-11-01: 500 [IU] via INTRAVENOUS
  Filled 2020-11-01: qty 5
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
## 2020-11-01 NOTE — Progress Notes (Signed)
Patient here for oncology follow-up appointment, expresses concerns of generalized weakness and loss of taste/appettite . Patient requests refill on lyrcia

## 2020-11-01 NOTE — Progress Notes (Signed)
Pt received IV hydration in addition to IV potassium over 2 hours. Pt informed that 2 prescriptions have been sent to his pharmacy Lyrica and oral potassium. Discharged to home in wheelchair. VSS.

## 2020-11-01 NOTE — Progress Notes (Signed)
Nutrition Assessment   Reason for Assessment:  Taste alterations, weight loss, poor appetite   ASSESSMENT:  75 year old male with stage IV adenocarcinoma of lung.  Past medical history of HLD, HTN, Fe deficiency anemia.  Patient receiving chemotherapy.   Met with patient during IV fluids.  Son was in attendance as well.  Son lives with patient.  Patient reports that appetite has been decreased since starting treatment.  Patient reports that food taste like cardboard.  He says he tries a few bites and can't eat anymore. Denies nausea.  Drinking ensure clear shakes 1-2 per day (180 calories). Says that he does not really like them but trying to drink them.  Does not like milky ensure or boost. Ryan Harvey was able to drink and ensure clear (180 calories, 9 g protein).  No solid foods yesterday.      Medications: marinol, ativan, zyprexa, zofran, KCL   Labs: glucose 106, K 2.5, Na 132   Anthropometrics:   Height: 69 inches Weight: 137 lb on 5/5 172 lb in 07/23/31 BMI: 20  23% weight loss in the last 3 months, significant   Estimated Energy Needs  Kcals: 1800-2100 Protein: 90-105 g Fluid: 1.8 L   NUTRITION DIAGNOSIS: Inadequate oral intake related to cancer related treatment side effects (taste change) as evidenced by 23% weight loss in the last 3 months and eating less than 75% of estimated energy needs for greater than or equal to 1 month.   MALNUTRITION DIAGNOSIS: Patient meets criteria for severe malnutrition in context of chronic illness as evidenced by 23% weight loss in the last 3 months and eating less 75% of estimated energy needs for greater than or equal to 1 month   INTERVENTION:  Discussed strategies to help with taste change. Handout provided  Gave samples of Dillard [place] 1.4 shake (thinner), boost breeze, boost soothe and carnation breakfast essential packet.   Recipes booklet given as well.  Encouraged patient to set schedule to "nibble/eat" Contact information  given   MONITORING, EVALUATION, GOAL: weight trends, intake   Next Visit: Friday, May 27 during infusion  Ryan Harvey B. Zenia Resides, [place], [place] Registered Dietitian [phone] (mobile)

## 2020-11-08 ENCOUNTER — Emergency Department: Payer: Medicare HMO

## 2020-11-08 ENCOUNTER — Inpatient Hospital Stay
Admission: EM | Admit: 2020-11-08 | Discharge: 2020-11-10 | DRG: 180 | Disposition: A | Discharge status: Hospice | Payer: Medicare HMO | Attending: Internal Medicine | Admitting: Internal Medicine

## 2020-11-08 ENCOUNTER — Other Ambulatory Visit: Payer: Self-pay

## 2020-11-08 ENCOUNTER — Inpatient Hospital Stay: Payer: Medicare HMO

## 2020-11-08 ENCOUNTER — Telehealth: Payer: Self-pay | Admitting: *Deleted

## 2020-11-08 ENCOUNTER — Inpatient Hospital Stay (HOSPITAL_BASED_OUTPATIENT_CLINIC_OR_DEPARTMENT_OTHER): Payer: Medicare HMO | Admitting: Hospice and Palliative Medicine

## 2020-11-08 ENCOUNTER — Encounter: Payer: Self-pay | Admitting: Hospice and Palliative Medicine

## 2020-11-08 ENCOUNTER — Telehealth: Payer: Self-pay

## 2020-11-08 ENCOUNTER — Other Ambulatory Visit: Payer: Self-pay | Admitting: *Deleted

## 2020-11-08 VITALS — BP 114/73 | HR 119 | Temp 97.2°F | Resp 17

## 2020-11-08 DIAGNOSIS — R634 Abnormal weight loss: Secondary | ICD-10-CM | POA: Diagnosis present

## 2020-11-08 DIAGNOSIS — C3491 Malignant neoplasm of unspecified part of right bronchus or lung: Secondary | ICD-10-CM

## 2020-11-08 DIAGNOSIS — A419 Sepsis, unspecified organism: Secondary | ICD-10-CM | POA: Diagnosis not present

## 2020-11-08 DIAGNOSIS — Z823 Family history of stroke: Secondary | ICD-10-CM | POA: Diagnosis not present

## 2020-11-08 DIAGNOSIS — C7951 Secondary malignant neoplasm of bone: Secondary | ICD-10-CM | POA: Diagnosis present

## 2020-11-08 DIAGNOSIS — Z79899 Other long term (current) drug therapy: Secondary | ICD-10-CM

## 2020-11-08 DIAGNOSIS — G893 Neoplasm related pain (acute) (chronic): Secondary | ICD-10-CM | POA: Diagnosis present

## 2020-11-08 DIAGNOSIS — R4182 Altered mental status, unspecified: Secondary | ICD-10-CM | POA: Diagnosis not present

## 2020-11-08 DIAGNOSIS — R410 Disorientation, unspecified: Secondary | ICD-10-CM | POA: Diagnosis not present

## 2020-11-08 DIAGNOSIS — Z515 Encounter for palliative care: Secondary | ICD-10-CM | POA: Diagnosis not present

## 2020-11-08 DIAGNOSIS — Z885 Allergy status to narcotic agent status: Secondary | ICD-10-CM | POA: Diagnosis not present

## 2020-11-08 DIAGNOSIS — Z87891 Personal history of nicotine dependence: Secondary | ICD-10-CM | POA: Diagnosis not present

## 2020-11-08 DIAGNOSIS — Z8249 Family history of ischemic heart disease and other diseases of the circulatory system: Secondary | ICD-10-CM | POA: Diagnosis not present

## 2020-11-08 DIAGNOSIS — D72829 Elevated white blood cell count, unspecified: Secondary | ICD-10-CM | POA: Diagnosis not present

## 2020-11-08 DIAGNOSIS — E876 Hypokalemia: Secondary | ICD-10-CM | POA: Diagnosis not present

## 2020-11-08 DIAGNOSIS — E785 Hyperlipidemia, unspecified: Secondary | ICD-10-CM | POA: Diagnosis present

## 2020-11-08 DIAGNOSIS — Z20822 Contact with and (suspected) exposure to covid-19: Secondary | ICD-10-CM | POA: Diagnosis not present

## 2020-11-08 DIAGNOSIS — G9341 Metabolic encephalopathy: Secondary | ICD-10-CM | POA: Diagnosis present

## 2020-11-08 DIAGNOSIS — F05 Delirium due to known physiological condition: Secondary | ICD-10-CM | POA: Diagnosis present

## 2020-11-08 DIAGNOSIS — Z7902 Long term (current) use of antithrombotics/antiplatelets: Secondary | ICD-10-CM

## 2020-11-08 DIAGNOSIS — G9340 Encephalopathy, unspecified: Secondary | ICD-10-CM | POA: Diagnosis not present

## 2020-11-08 DIAGNOSIS — G319 Degenerative disease of nervous system, unspecified: Secondary | ICD-10-CM | POA: Diagnosis not present

## 2020-11-08 DIAGNOSIS — R627 Adult failure to thrive: Secondary | ICD-10-CM | POA: Diagnosis present

## 2020-11-08 DIAGNOSIS — I6782 Cerebral ischemia: Secondary | ICD-10-CM | POA: Diagnosis not present

## 2020-11-08 DIAGNOSIS — I10 Essential (primary) hypertension: Secondary | ICD-10-CM | POA: Diagnosis present

## 2020-11-08 DIAGNOSIS — J910 Malignant pleural effusion: Secondary | ICD-10-CM | POA: Diagnosis not present

## 2020-11-08 DIAGNOSIS — I959 Hypotension, unspecified: Secondary | ICD-10-CM | POA: Diagnosis present

## 2020-11-08 DIAGNOSIS — I2510 Atherosclerotic heart disease of native coronary artery without angina pectoris: Secondary | ICD-10-CM | POA: Diagnosis not present

## 2020-11-08 DIAGNOSIS — Z66 Do not resuscitate: Secondary | ICD-10-CM | POA: Diagnosis present

## 2020-11-08 DIAGNOSIS — E860 Dehydration: Secondary | ICD-10-CM | POA: Diagnosis present

## 2020-11-08 DIAGNOSIS — Z955 Presence of coronary angioplasty implant and graft: Secondary | ICD-10-CM

## 2020-11-08 DIAGNOSIS — C3431 Malignant neoplasm of lower lobe, right bronchus or lung: Secondary | ICD-10-CM

## 2020-11-08 DIAGNOSIS — R079 Chest pain, unspecified: Secondary | ICD-10-CM | POA: Diagnosis not present

## 2020-11-08 DIAGNOSIS — C3490 Malignant neoplasm of unspecified part of unspecified bronchus or lung: Secondary | ICD-10-CM | POA: Diagnosis not present

## 2020-11-08 DIAGNOSIS — Z681 Body mass index (BMI) 19 or less, adult: Secondary | ICD-10-CM

## 2020-11-08 DIAGNOSIS — J9811 Atelectasis: Secondary | ICD-10-CM | POA: Diagnosis not present

## 2020-11-08 DIAGNOSIS — R000 Tachycardia, unspecified: Secondary | ICD-10-CM | POA: Diagnosis not present

## 2020-11-08 DIAGNOSIS — Z7189 Other specified counseling: Secondary | ICD-10-CM | POA: Diagnosis not present

## 2020-11-08 DIAGNOSIS — Z91013 Allergy to seafood: Secondary | ICD-10-CM | POA: Diagnosis not present

## 2020-11-08 DIAGNOSIS — R0602 Shortness of breath: Secondary | ICD-10-CM | POA: Diagnosis not present

## 2020-11-08 DIAGNOSIS — J90 Pleural effusion, not elsewhere classified: Secondary | ICD-10-CM | POA: Diagnosis not present

## 2020-11-08 LAB — CBC
HCT: 33.5 % — ABNORMAL LOW (ref 39.0–52.0)
Hemoglobin: 10.5 g/dL — ABNORMAL LOW (ref 13.0–17.0)
MCH: 29.8 pg (ref 26.0–34.0)
MCHC: 31.3 g/dL (ref 30.0–36.0)
MCV: 95.2 fL (ref 80.0–100.0)
Platelets: 220 10*3/uL (ref 150–400)
RBC: 3.52 MIL/uL — ABNORMAL LOW (ref 4.22–5.81)
RDW: 16.5 % — ABNORMAL HIGH (ref 11.5–15.5)
WBC: 18 10*3/uL — ABNORMAL HIGH (ref 4.0–10.5)
nRBC: 0 % (ref 0.0–0.2)

## 2020-11-08 LAB — COMPREHENSIVE METABOLIC PANEL
ALT: 28 U/L (ref 0–44)
ALT: 28 U/L (ref 0–44)
AST: 43 U/L — ABNORMAL HIGH (ref 15–41)
AST: 42 U/L — ABNORMAL HIGH (ref 15–41)
Albumin: 2.9 g/dL — ABNORMAL LOW (ref 3.5–5.0)
Albumin: 2.8 g/dL — ABNORMAL LOW (ref 3.5–5.0)
Alkaline Phosphatase: 92 U/L (ref 38–126)
Alkaline Phosphatase: 86 U/L (ref 38–126)
Anion gap: 13 (ref 5–15)
Anion gap: 13 (ref 5–15)
BUN: 8 mg/dL (ref 8–23)
BUN: 9 mg/dL (ref 8–23)
CO2: 24 mmol/L (ref 22–32)
CO2: 25 mmol/L (ref 22–32)
Calcium: 8.4 mg/dL — ABNORMAL LOW (ref 8.9–10.3)
Calcium: 8.6 mg/dL — ABNORMAL LOW (ref 8.9–10.3)
Chloride: 98 mmol/L (ref 98–111)
Chloride: 99 mmol/L (ref 98–111)
Creatinine, Ser: 0.97 mg/dL (ref 0.61–1.24)
Creatinine, Ser: 0.94 mg/dL (ref 0.61–1.24)
GFR, Estimated: >60 mL/min (ref >60)
GFR, Estimated: >60 mL/min (ref >60)
Glucose, Bld: 118 mg/dL — ABNORMAL HIGH (ref 70–99)
Glucose, Bld: 121 mg/dL — ABNORMAL HIGH (ref 70–99)
Potassium: 3.1 mmol/L — ABNORMAL LOW (ref 3.5–5.1)
Potassium: 2.9 mmol/L — ABNORMAL LOW (ref 3.5–5.1)
Sodium: 135 mmol/L (ref 135–145)
Sodium: 137 mmol/L (ref 135–145)
Total Bilirubin: 1 mg/dL (ref 0.3–1.2)
Total Bilirubin: 1.2 mg/dL (ref 0.3–1.2)
Total Protein: 6.7 g/dL (ref 6.5–8.1)
Total Protein: 6.6 g/dL (ref 6.5–8.1)

## 2020-11-08 LAB — URINALYSIS, COMPLETE (UACMP) WITH MICROSCOPIC
Bilirubin Urine: NEGATIVE
Glucose, UA: NEGATIVE mg/dL
Hgb urine dipstick: NEGATIVE
Ketones, ur: 5 mg/dL — AB
Leukocytes,Ua: NEGATIVE
Nitrite: NEGATIVE
Protein, ur: 30 mg/dL — AB
Specific Gravity, Urine: 1.012 (ref 1.005–1.030)
Squamous Epithelial / HPF: NONE SEEN (ref 0–5)
pH: 5 (ref 5.0–8.0)

## 2020-11-08 LAB — CBC WITH DIFFERENTIAL/PLATELET
Abs Immature Granulocytes: 0.14 10*3/uL — ABNORMAL HIGH (ref 0.00–0.07)
Basophils Absolute: 0.1 10*3/uL (ref 0.0–0.1)
Basophils Relative: 0 %
Eosinophils Absolute: 0 10*3/uL (ref 0.0–0.5)
Eosinophils Relative: 0 %
HCT: 34.2 % — ABNORMAL LOW (ref 39.0–52.0)
Hemoglobin: 10.8 g/dL — ABNORMAL LOW (ref 13.0–17.0)
Immature Granulocytes: 1 %
Lymphocytes Relative: 8 %
Lymphs Abs: 1.6 10*3/uL (ref 0.7–4.0)
MCH: 30 pg (ref 26.0–34.0)
MCHC: 31.6 g/dL (ref 30.0–36.0)
MCV: 95 fL (ref 80.0–100.0)
Monocytes Absolute: 1 10*3/uL (ref 0.1–1.0)
Monocytes Relative: 5 %
Neutro Abs: 16.8 10*3/uL — ABNORMAL HIGH (ref 1.7–7.7)
Neutrophils Relative %: 86 %
Platelets: 237 10*3/uL (ref 150–400)
RBC: 3.6 MIL/uL — ABNORMAL LOW (ref 4.22–5.81)
RDW: 16.2 % — ABNORMAL HIGH (ref 11.5–15.5)
WBC: 19.6 10*3/uL — ABNORMAL HIGH (ref 4.0–10.5)
nRBC: 0 % (ref 0.0–0.2)

## 2020-11-08 LAB — RESP PANEL BY RT-PCR (FLU A&B, COVID) ARPGX2
Influenza A by PCR: NEGATIVE
Influenza B by PCR: NEGATIVE
SARS Coronavirus 2 by RT PCR: NEGATIVE

## 2020-11-08 LAB — TROPONIN I (HIGH SENSITIVITY)
Troponin I (High Sensitivity): 22 ng/L — ABNORMAL HIGH (ref <18)
Troponin I (High Sensitivity): 24 ng/L — ABNORMAL HIGH (ref <18)

## 2020-11-08 MED ORDER — AMLODIPINE BESYLATE 5 MG PO TABS: 5.0000 mg | ORAL_TABLET | ORAL | Status: DC

## 2020-11-08 MED ORDER — CYCLOBENZAPRINE HCL 10 MG PO TABS: 10.0000 mg | ORAL_TABLET | Freq: Three times a day (TID) | ORAL | Status: DC | PRN

## 2020-11-08 MED ORDER — FENTANYL 12 MCG/HR TD PT72: 1.0000 | MEDICATED_PATCH | TRANSDERMAL | Status: DC

## 2020-11-08 MED ORDER — OXYCODONE HCL 5 MG PO TABS: 10.0000 mg | ORAL_TABLET | ORAL | Status: DC | PRN

## 2020-11-08 MED ORDER — SODIUM CHLORIDE 0.9 % IV SOLN
500.0000 mg | Freq: Once | INTRAVENOUS | Status: AC
  Administered 2020-11-09: 02:00:00 500 mg via INTRAVENOUS
  Filled 2020-11-08: qty 500

## 2020-11-08 MED ORDER — LIDOCAINE-PRILOCAINE 2.5-2.5 % EX CREA
TOPICAL_CREAM | Freq: Once | CUTANEOUS | Status: DC
  Filled 2020-11-08: qty 5

## 2020-11-08 MED ORDER — ONDANSETRON HCL 4 MG/2ML IJ SOLN: 4.0000 mg | Freq: Four times a day (QID) | INTRAMUSCULAR | Status: DC | PRN

## 2020-11-08 MED ORDER — ATORVASTATIN CALCIUM 20 MG PO TABS
80.0000 mg | ORAL_TABLET | Freq: Every day | ORAL | Status: DC
  Administered 2020-11-09 (×2): 80 mg via ORAL
  Filled 2020-11-08 (×2): qty 4

## 2020-11-08 MED ORDER — CLOPIDOGREL BISULFATE 75 MG PO TABS
75.0000 mg | ORAL_TABLET | Freq: Every day | ORAL | Status: DC
  Administered 2020-11-09: 08:00:00 75 mg via ORAL
  Filled 2020-11-08: qty 1

## 2020-11-08 MED ORDER — DRONABINOL 2.5 MG PO CAPS
2.5000 mg | ORAL_CAPSULE | Freq: Two times a day (BID) | ORAL | Status: DC
  Administered 2020-11-09: 2.5 mg via ORAL
  Filled 2020-11-08: qty 1

## 2020-11-08 MED ORDER — POTASSIUM CHLORIDE CRYS ER 20 MEQ PO TBCR
40.0000 meq | EXTENDED_RELEASE_TABLET | Freq: Two times a day (BID) | ORAL | Status: DC
  Administered 2020-11-09: 40 meq via ORAL
  Filled 2020-11-08: qty 2

## 2020-11-08 MED ORDER — SODIUM CHLORIDE 0.9% FLUSH: 3.0000 mL | INTRAVENOUS | Status: DC | PRN

## 2020-11-08 MED ORDER — ONDANSETRON HCL 4 MG PO TABS: 4.0000 mg | ORAL_TABLET | Freq: Four times a day (QID) | ORAL | Status: DC | PRN

## 2020-11-08 MED ORDER — PREGABALIN 75 MG PO CAPS
75.0000 mg | ORAL_CAPSULE | Freq: Two times a day (BID) | ORAL | Status: DC
  Administered 2020-11-09 (×3): 75 mg via ORAL
  Filled 2020-11-08 (×3): qty 1

## 2020-11-08 MED ORDER — ENOXAPARIN SODIUM 40 MG/0.4ML IJ SOSY
40.0000 mg | PREFILLED_SYRINGE | INTRAMUSCULAR | Status: DC
  Administered 2020-11-09: 20:00:00 40 mg via SUBCUTANEOUS
  Filled 2020-11-08: qty 0.4

## 2020-11-08 MED ORDER — SODIUM CHLORIDE 0.9 % IV BOLUS
1000.0000 mL | Freq: Once | INTRAVENOUS | Status: AC
  Administered 2020-11-08: 1000 mL via INTRAVENOUS

## 2020-11-08 MED ORDER — IOHEXOL 300 MG/ML  SOLN
75.0000 mL | Freq: Once | INTRAMUSCULAR | Status: AC | PRN
  Administered 2020-11-08: 75 mL via INTRAVENOUS

## 2020-11-08 MED ORDER — POTASSIUM CHLORIDE IN NACL 20-0.9 MEQ/L-% IV SOLN
Freq: Once | INTRAVENOUS | Status: AC
  Filled 2020-11-08: qty 1000

## 2020-11-08 MED ORDER — ACETAMINOPHEN 325 MG PO TABS: 650.0000 mg | ORAL_TABLET | Freq: Four times a day (QID) | ORAL | Status: DC | PRN

## 2020-11-08 MED ORDER — MORPHINE SULFATE 15 MG PO TABS: 15.0000 mg | ORAL_TABLET | Freq: Four times a day (QID) | ORAL | Status: DC | PRN

## 2020-11-08 MED ORDER — ACETAMINOPHEN 650 MG RE SUPP: 650.0000 mg | Freq: Four times a day (QID) | RECTAL | Status: DC | PRN

## 2020-11-08 MED ORDER — SODIUM CHLORIDE 0.9 % IV SOLN
1.0000 g | Freq: Once | INTRAVENOUS | Status: AC
  Administered 2020-11-08: 1 g via INTRAVENOUS
  Filled 2020-11-08: qty 10

## 2020-11-08 MED ORDER — SODIUM CHLORIDE 0.9 % IV SOLN: 250.0000 mL | INTRAVENOUS | Status: DC | PRN

## 2020-11-08 MED ORDER — TIZANIDINE HCL 2 MG PO TABS
2.0000 mg | ORAL_TABLET | Freq: Three times a day (TID) | ORAL | Status: DC | PRN
  Filled 2020-11-08: qty 1

## 2020-11-08 MED ORDER — SODIUM CHLORIDE 0.9% FLUSH: 3.0000 mL | Freq: Two times a day (BID) | INTRAVENOUS | Status: DC
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
## 2020-11-08 NOTE — Progress Notes (Signed)
Patient here for oncology follow-up appointment, expresses confusion since sat, chest pain, fatigue and constipation

## 2020-11-08 NOTE — Telephone Encounter (Signed)
I called and spoke with patient's sister.  She reports that patient has 3 to 4 days of progressive confusion with visual and auditory hallucinations.  Patient also has progressive weakness with minimal oral intake.  Overall, patient has been declining for some weeks.  No fever or chills.  No pain, nausea, vomiting, or diarrhea.  We discussed option for clinic eval versus a focus on comfort care including option for hospice.  Family would prefer patient to be evaluated in the clinic.  We will bring him in for [facility], labs, and probable fluids.

## 2020-11-08 NOTE — Telephone Encounter (Signed)
Can you please see him. He has been declining. Hospice is appropriate if he does not wish to continue rx
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
## 2020-11-08 NOTE — Telephone Encounter (Signed)
Call from [facility] reporting that patient has been different since Saturday evening. He is talking to people who are not there. He is still not eating or drinking much, and is very weak needing assistance with his walker now. Denies any fever or shortness of breath or other symptoms. Please advise

## 2020-11-08 NOTE — Progress Notes (Signed)
Patient walked over to the ED, report called per Merrily Pew, NP
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
## 2020-11-08 NOTE — ED Triage Notes (Addendum)
Cancer center called ahead and informed that pt would be coming with c/o new confusion and rapidly declining per MD. Pt has stage 4 lung cancer. Wife with pt at this time.  Wife states pt became confused Saturday morning. Wife reports pt did c/o CP one time. Pt also gets more SOB when walking. Pt denies any belly pain or pain with urination.

## 2020-11-09 DIAGNOSIS — C3491 Malignant neoplasm of unspecified part of right bronchus or lung: Principal | ICD-10-CM

## 2020-11-09 DIAGNOSIS — Z7189 Other specified counseling: Secondary | ICD-10-CM

## 2020-11-09 DIAGNOSIS — G9340 Encephalopathy, unspecified: Secondary | ICD-10-CM | POA: Diagnosis not present

## 2020-11-09 DIAGNOSIS — Z515 Encounter for palliative care: Secondary | ICD-10-CM | POA: Diagnosis not present

## 2020-11-09 DIAGNOSIS — R410 Disorientation, unspecified: Secondary | ICD-10-CM | POA: Diagnosis not present

## 2020-11-09 LAB — CBC
HCT: 29.2 % — ABNORMAL LOW (ref 39.0–52.0)
Hemoglobin: 9.2 g/dL — ABNORMAL LOW (ref 13.0–17.0)
MCH: 29.8 pg (ref 26.0–34.0)
MCHC: 31.5 g/dL (ref 30.0–36.0)
MCV: 94.5 fL (ref 80.0–100.0)
Platelets: 172 10*3/uL (ref 150–400)
RBC: 3.09 MIL/uL — ABNORMAL LOW (ref 4.22–5.81)
RDW: 16.4 % — ABNORMAL HIGH (ref 11.5–15.5)
WBC: 12.8 10*3/uL — ABNORMAL HIGH (ref 4.0–10.5)
nRBC: 0 % (ref 0.0–0.2)

## 2020-11-09 LAB — BASIC METABOLIC PANEL
Anion gap: 9 (ref 5–15)
BUN: 7 mg/dL — ABNORMAL LOW (ref 8–23)
CO2: 26 mmol/L (ref 22–32)
Calcium: 7.5 mg/dL — ABNORMAL LOW (ref 8.9–10.3)
Chloride: 105 mmol/L (ref 98–111)
Creatinine, Ser: 0.71 mg/dL (ref 0.61–1.24)
GFR, Estimated: >60 mL/min (ref >60)
Glucose, Bld: 99 mg/dL (ref 70–99)
Potassium: 3.1 mmol/L — ABNORMAL LOW (ref 3.5–5.1)
Sodium: 140 mmol/L (ref 135–145)

## 2020-11-09 LAB — MAGNESIUM: Magnesium: 1.2 mg/dL — ABNORMAL LOW (ref 1.7–2.4)

## 2020-11-09 LAB — VITAMIN B12: Vitamin B-12: 4087 pg/mL — ABNORMAL HIGH (ref 180–914)

## 2020-11-09 LAB — LACTIC ACID, PLASMA: Lactic Acid, Venous: 1.1 mmol/L (ref 0.5–1.9)

## 2020-11-09 LAB — AMMONIA: Ammonia: 12 umol/L (ref 9–35)

## 2020-11-09 MED ORDER — POLYETHYLENE GLYCOL 3350 17 G PO PACK
17.0000 g | PACK | Freq: Two times a day (BID) | ORAL | Status: DC
  Administered 2020-11-09: 12:00:00 17 g via ORAL
  Filled 2020-11-09: qty 1

## 2020-11-09 MED ORDER — POTASSIUM CHLORIDE 20 MEQ PO PACK
40.0000 meq | PACK | Freq: Once | ORAL | Status: AC
  Administered 2020-11-09: 40 meq via ORAL
  Filled 2020-11-09: qty 2

## 2020-11-09 MED ORDER — SENNA 8.6 MG PO TABS
1.0000 | ORAL_TABLET | Freq: Two times a day (BID) | ORAL | Status: DC
  Administered 2020-11-09: 19:00:00 8.6 mg via ORAL
  Filled 2020-11-09: qty 1

## 2020-11-09 MED ORDER — POTASSIUM CHLORIDE CRYS ER 20 MEQ PO TBCR
40.0000 meq | EXTENDED_RELEASE_TABLET | Freq: Once | ORAL | Status: AC
  Administered 2020-11-09: 19:00:00 40 meq via ORAL
  Filled 2020-11-09: qty 2

## 2020-11-09 MED ORDER — SENNA 8.6 MG PO TABS: 1.0000 | ORAL_TABLET | Freq: Every day | ORAL | Status: DC

## 2020-11-09 MED ORDER — DEXAMETHASONE 4 MG PO TABS
2.0000 mg | ORAL_TABLET | Freq: Every day | ORAL | Status: DC
  Administered 2020-11-09: 19:00:00 2 mg via ORAL
  Filled 2020-11-09: qty 1

## 2020-11-09 MED ORDER — SODIUM CHLORIDE 0.9 % IV SOLN: INTRAVENOUS | Status: DC

## 2020-11-09 MED ORDER — MAGNESIUM SULFATE 2 GM/50ML IV SOLN
2.0000 g | Freq: Once | INTRAVENOUS | Status: DC
  Filled 2020-11-09: qty 50

## 2020-11-09 MED ORDER — MAGNESIUM SULFATE 4 GM/100ML IV SOLN
4.0000 g | Freq: Once | INTRAVENOUS | Status: AC
  Administered 2020-11-09: 06:00:00 4 g via INTRAVENOUS
  Filled 2020-11-09: qty 100

## 2020-11-09 MED ORDER — POTASSIUM CHLORIDE 10 MEQ/100ML IV SOLN
10.0000 meq | INTRAVENOUS | Status: DC
  Administered 2020-11-09 (×2): 10 meq via INTRAVENOUS
  Filled 2020-11-09 (×2): qty 100

## 2020-11-09 MED ORDER — POTASSIUM CHLORIDE CRYS ER 20 MEQ PO TBCR
40.0000 meq | EXTENDED_RELEASE_TABLET | ORAL | Status: DC
  Administered 2020-11-09: 40 meq via ORAL
  Filled 2020-11-09: qty 2

## 2020-11-09 MED ORDER — ENSURE ENLIVE PO LIQD
237.0000 mL | Freq: Two times a day (BID) | ORAL | Status: DC
  Administered 2020-11-09: 14:00:00 237 mL via ORAL

## 2020-11-09 NOTE — Progress Notes (Addendum)
[place] [facility] Hospital Liaison RN note:  Received request from Altha Harm, NP for hospice services at home after discharge. Chart and patient information under review by [facility] physician and hospice eligibility was approved.   Spoke with son, Marjory Lies to initiate education related to hospice philosophy, services and to answer any questions. He verbalized understanding and all questions were answered. Plan is to discharge home tomorrow by private vehicle. Hospital care team is aware.  DME needs discussed. Marjory Lies does not think any DME is needed at this time and will wait until assessed by hospice RN. Address is correct on facesheet and contact is SO Betty at [phone].  Please send signed and completed DNR home with patient and provide any prescriptions at discharge as needed to ensure ongoing symptom management.  Please call with any hospice related questions or concerns.  Thank you for the opportunity to participate in this patient's care.  Zandra Abts, RN [facility] Liaison  [phone]

## 2020-11-09 NOTE — Progress Notes (Signed)
[employer] Code status clarified with patien'ts son Sankalp Ferrell over the phone.  patient is to remain a DNR as initiated in oncology office 11/08/2021.

## 2020-11-09 NOTE — Progress Notes (Signed)
PROGRESS NOTE    Ryan Harvey  NFA:[id number] DOB: [birth year] DOA: 11/08/2020 PCP: Donnamarie Rossetti, PA-C   Brief Narrative: 75 year old with past medical history significant for stage IV non-small cell lung cancer (adenocarcinoma), currently on chemotherapy (last treatment 2 weeks prior to this admission), recent hospitalization at [facility] for spinal cord compression related to thoracic and lumbar metastatic disease for which patient was treated with palliative radiation therapy with improvement of his symptoms, patient was referred to the ER for evaluation of altered mental status.  Patient has had with Torian visual hallucination over the last couple of days prior to admission.  Poor oral intake and progressive weakness and weight loss.  Patient also having constipation and worsening shortness of breath on exertion. Evaluation in the ED: Chest x-ray mild right basilar atelectasis, CT head: No acute evidence of intracranial abnormality.  CT scan chest abdomen and pelvis showed progression of metastatic disease in chest abdomen and pelvis compared to prior CT 06/09/2020.     Assessment & Plan:   Principal Problem:   AMS (altered mental status) Active Problems:   CAD (coronary artery disease)   Stage IV adenocarcinoma of lung, right (HCC)   Benign essential HTN   Hypokalemia   Leukocytosis  1-Acute metabolic encephalopathy: Delirium, could be related to dehydration, poor oral intake and underlying malignancy: -CT head negative for acute intracranial abnormalities -Patient appears to have improved with hydration.  Friend at bedside thinks that he has improved today. -B-12: pending. Ammonia; 12. No evidence of acute infection, urine negative for infection. Chest x ray Basilar atelectasis.   2-FTT;  Secondary to underline malignancy.  Schedule Bowel regimen.  Added ensure.  On Marinol for appetite.  Palliative care follow up.   3-Hypomagnesemia; replete IV Hypokalemia; replete  orally.   4-Tachycardia; Continue with IV fluids.  No hypoxemia.   5-Stage IV Adenocarcinoma of right lung.  Continue with pain management. On fentanyl patch.  S/P Palliative Radiation for cord compression related to metastatic diseases.  Follows with Dr Janese Banks.  Palliative care consulted for continuation of goals of care.  CT chest, abdomen showed progression of metastatic diseases.   6-HTN; hold Norvasc. SBP soft.   7-H/O CAD; Continue with plavix and statins.   8-Leukocytosis;  Received a dose of IV antibiotics in the ED.  Monitor off antibiotics.  UA clear, Chest x ray atelectasis.      Estimated body mass index is 19.72 kg/m as calculated from the following:   Height as of this encounter: 5' 9.13" (1.756 m).   Weight as of this encounter: 60.8 kg.   DVT prophylaxis: Lovenox Code Status: DNR Family Communication: Friend who was at bedside Disposition Plan:  Status is: Inpatient  Remains inpatient appropriate because:IV treatments appropriate due to intensity of illness or inability to take PO   Dispo: The patient is from: Home              Anticipated d/c is to: Home              Patient currently is not medically stable to d/c.   Difficult to place patient No        Consultants:   Palliative care   Procedures:   None  Antimicrobials:    Subjective: He is alert, sitting recliner. He was oriented to person, place and situation. Friend at bedside, who take care of patient at home think he is doing  Better, less confuse. No hallucination.    Objective: Vitals:   11/09/20  0025 11/09/20 0231 11/09/20 0400 11/09/20 0508  BP: 120/87 114/64 128/77   Pulse: (!) 119 (!) 113 (!) 109   Resp: 20 19 18    Temp: 97.9 F (36.6 C) 98.1 F (36.7 C) 98 F (36.7 C)   TempSrc: Oral Oral    SpO2: 100% 99% 96%   Weight:    60.8 kg  Height:    5' 9.13" (1.756 m)    Intake/Output Summary (Last 24 hours) at 11/09/2020 0729 Last data filed at 11/08/2020  2113 Gross per 24 hour  Intake 1000 ml  Output --  Net 1000 ml   Filed Weights   11/09/20 0508  Weight: 60.8 kg    Examination:  General exam: Appears calm and comfortable  Respiratory system:  Respiratory effort normal. No wheezing.  Cardiovascular system: S1 & S2 heard, RRR. No JVD, murmurs, rubs, gallops or clicks. No pedal edema. Gastrointestinal system: Abdomen is nondistended, soft and nontender. No organomegaly or masses felt. Normal bowel sounds heard. Central nervous system: Alert and oriented. Follows command Extremities: Symmetric 5 x 5 power.  Data Reviewed: I have personally reviewed following labs and imaging studies  CBC: Recent Labs  Lab 11/08/20 1451 11/08/20 1731 11/09/20 0420  WBC 19.6* 18.0* 12.8*  NEUTROABS 16.8*  --   --   HGB 10.8* 10.5* 9.2*  HCT 34.2* 33.5* 29.2*  MCV 95.0 95.2 94.5  PLT 237 220 841   Basic Metabolic Panel: Recent Labs  Lab 11/08/20 1451 11/08/20 1731 11/09/20 0420  NA 135 137 140  K 3.1* 2.9* 3.1*  CL 98 99 105  CO2 24 25 26   GLUCOSE 118* 121* 99  BUN 8 9 7*  CREATININE 0.97 0.94 0.71  CALCIUM 8.4* 8.6* 7.5*  MG  --   --  1.2*   GFR: Estimated Creatinine Clearance: 68.6 mL/min (by C-G formula based on SCr of 0.71 mg/dL). Liver Function Tests: Recent Labs  Lab 11/08/20 1451 11/08/20 1731  AST 43* 42*  ALT 28 28  ALKPHOS 92 86  BILITOT 1.0 1.2  PROT 6.7 6.6  ALBUMIN 2.9* 2.8*   No results for input(s): LIPASE, AMYLASE in the last 168 hours. No results for input(s): AMMONIA in the last 168 hours. Coagulation Profile: No results for input(s): INR, PROTIME in the last 168 hours. Cardiac Enzymes: No results for input(s): CKTOTAL, CKMB, CKMBINDEX, TROPONINI in the last 168 hours. BNP (last 3 results) No results for input(s): PROBNP in the last 8760 hours. HbA1C: No results for input(s): HGBA1C in the last 72 hours. CBG: No results for input(s): GLUCAP in the last 168 hours. Lipid Profile: No results for  input(s): CHOL, HDL, LDLCALC, TRIG, CHOLHDL, LDLDIRECT in the last 72 hours. Thyroid Function Tests: No results for input(s): TSH, T4TOTAL, FREET4, T3FREE, THYROIDAB in the last 72 hours. Anemia Panel: No results for input(s): VITAMINB12, FOLATE, FERRITIN, TIBC, IRON, RETICCTPCT in the last 72 hours. Sepsis Labs: Recent Labs  Lab 11/09/20 0420  LATICACIDVEN 1.1    Recent Results (from the past 240 hour(s))  Resp Panel by RT-PCR (Flu A&B, Covid) Nasopharyngeal Swab     Status: None   Collection Time: 11/08/20  9:38 PM   Specimen: Nasopharyngeal Swab; Nasopharyngeal(NP) swabs in vial transport medium  Result Value Ref Range Status   SARS Coronavirus 2 by RT PCR NEGATIVE NEGATIVE Final    Comment: (NOTE) SARS-CoV-2 target nucleic acids are NOT DETECTED.  The SARS-CoV-2 RNA is generally detectable in upper respiratory specimens during the acute phase of infection.  The lowest concentration of SARS-CoV-2 viral copies this assay can detect is 138 copies/mL. A negative result does not preclude SARS-Cov-2 infection and should not be used as the sole basis for treatment or other patient management decisions. A negative result may occur with  improper specimen collection/handling, submission of specimen other than nasopharyngeal swab, presence of viral mutation(s) within the areas targeted by this assay, and inadequate number of viral copies(<138 copies/mL). A negative result must be combined with clinical observations, patient history, and epidemiological information. The expected result is Negative.  Fact Sheet for Patients:  EntrepreneurPulse.com.au  Fact Sheet for Healthcare Providers:  IncredibleEmployment.be  This test is no t yet approved or cleared by the [place] FDA and  has been authorized for detection and/or diagnosis of SARS-CoV-2 by FDA under an Emergency Use Authorization (EUA). This EUA will remain  in effect (meaning this test  can be used) for the duration of the COVID-19 declaration under Section 564(b)(1) of the Act, 21 U.S.C.section 360bbb-3(b)(1), unless the authorization is terminated  or revoked sooner.       Influenza A by PCR NEGATIVE NEGATIVE Final   Influenza B by PCR NEGATIVE NEGATIVE Final    Comment: (NOTE) The Xpert Xpress SARS-CoV-2/FLU/RSV plus assay is intended as an aid in the diagnosis of influenza from Nasopharyngeal swab specimens and should not be used as a sole basis for treatment. Nasal washings and aspirates are unacceptable for Xpert Xpress SARS-CoV-2/FLU/RSV testing.  Fact Sheet for Patients: EntrepreneurPulse.com.au  Fact Sheet for Healthcare Providers: IncredibleEmployment.be  This test is not yet approved or cleared by the [place] FDA and has been authorized for detection and/or diagnosis of SARS-CoV-2 by FDA under an Emergency Use Authorization (EUA). This EUA will remain in effect (meaning this test can be used) for the duration of the COVID-19 declaration under Section 564(b)(1) of the Act, 21 U.S.C. section 360bbb-3(b)(1), unless the authorization is terminated or revoked.  Performed at [facility], [address] [place], [place] [address]          Radiology Studies: DG Chest 1 View  Result Date: 11/08/2020 CLINICAL DATA:  Chest pain, shortness of breath, stage IV lung cancer EXAM: CHEST  1 VIEW COMPARISON:  04/19/2020 FINDINGS: RIGHT jugular Port-A-Cath with tip projecting over superior RIGHT atrium. Normal heart size, mediastinal contours, and pulmonary vascularity. Interval removal of RIGHT PleurX catheter. Minimal atelectasis at RIGHT base. Remaining lungs clear. No definite pleural effusion or pneumothorax. IMPRESSION: Mild RIGHT basilar atelectasis. Electronically Signed   By: Lavonia Dana M.D.   On: 11/08/2020 17:09   CT Head Wo Contrast  Result Date: 11/08/2020 CLINICAL DATA:  Mental status change,  unknown cause. Additional provided: Confusion, stage IV lung cancer. EXAM: CT HEAD WITHOUT CONTRAST TECHNIQUE: Contiguous axial images were obtained from the base of the skull through the vertex without intravenous contrast. COMPARISON:  Brain MRI 03/03/2020. FINDINGS: Brain: Please note, assessment for intracranial metastatic disease is limited on this noncontrast head CT. Mild cerebral and cerebellar atrophy. Redemonstrated chronic small vessel infarct within the right corona radiata/basal ganglia. Background moderate/advanced patchy and ill-defined hypoattenuation within the cerebral white matter, nonspecific but compatible with chronic small vessel ischemic disease. There is no acute intracranial hemorrhage. No demarcated cortical infarct. No extra-axial fluid collection. No appreciable intracranial mass. No midline shift. Partially empty sella turcica. Vascular: No hyperdense vessel. Skull: Normal. Negative for fracture or focal lesion. Sinuses/Orbits: Visualized orbits show no acute finding. No significant paranasal sinus disease at the imaged levels. IMPRESSION:  Please note, assessment for intracranial metastatic disease is limited on this non-contrast head CT. No evidence of acute intracranial abnormality. Redemonstrated chronic small-vessel infarct within the right corona radiata/basal ganglia. Stable background moderate/advanced cerebral white matter chronic small vessel ischemic disease. Stable, mild generalized parenchymal atrophy. Electronically Signed   By: Kellie Simmering DO   On: 11/08/2020 18:32   CT HEAD W CONTRAST  Result Date: 11/08/2020 CLINICAL DATA:  Initial evaluation for metastatic disease. EXAM: CT HEAD WITH CONTRAST TECHNIQUE: Contiguous axial images were obtained from the base of the skull through the vertex with intravenous contrast. CONTRAST:  51mL OMNIPAQUE IOHEXOL 300 MG/ML  SOLN COMPARISON:  Prior CT from earlier the same day. FINDINGS: Brain: Postcontrast imaging of the brain  demonstrates no abnormal or pathologic enhancement. No mass lesion or evidence for intracranial metastatic disease. Atrophy with chronic small vessel ischemic disease again noted. No other acute or interval intracranial abnormality. Vascular: Normal intravascular enhancement seen throughout the intracranial vasculature. Skull: Scalp soft tissues within normal limits. Calvarium intact. No discrete osseous lesions. Sinuses/Orbits: Globes orbital soft tissues within normal limits. Paranasal sinuses and mastoid air cells are clear. Other: None. IMPRESSION: 1. No CT evidence for intracranial metastatic disease. 2. Atrophy with chronic small vessel ischemic disease. Electronically Signed   By: Jeannine Boga M.D.   On: 11/08/2020 23:58   CT CHEST ABDOMEN PELVIS WO CONTRAST  Result Date: 11/08/2020 CLINICAL DATA:  75 year old male with sepsis. Stage IV lung cancer. EXAM: CT CHEST, ABDOMEN AND PELVIS WITHOUT CONTRAST TECHNIQUE: Multidetector CT imaging of the chest, abdomen and pelvis was performed following the standard protocol without IV contrast. COMPARISON:  CT abdomen pelvis dated 09/15/2020. PET CT dated 10/06/2020 and CT of the chest abdomen pelvis dated 06/09/2020. FINDINGS: Evaluation of this exam is limited in the absence of intravenous contrast. CT CHEST FINDINGS Cardiovascular: There is no cardiomegaly or pericardial effusion. There is coronary vascular calcification. Right-sided Port-A-Cath with tip at the cavoatrial junction. There is mild atherosclerotic calcification of the thoracic aorta. No aneurysmal dilatation. The central pulmonary arteries are grossly unremarkable. Mediastinum/Nodes: No hilar or mediastinal adenopathy. Evaluation however is limited in the absence of intravenous contrast. The esophagus is grossly unremarkable. No mediastinal fluid collection. Lungs/Pleura: Trace right pleural effusion. There is mild apparent irregularity of the right pleural and slight nodularity of the right  posterior subpleural region in keeping with known metastatic disease and malignant effusion. Bilateral streaky and nodular densities primarily involving the posterior right lower lobe with interval progression compared to the CT of 06/09/2020. A 19 mm subpleural nodule in the right lower lobe (87/4) appears similar to prior CT. New nodular density in the right upper lobe with the linear component measuring up to 2.3 cm in length, new since the prior CT. There is no pneumothorax. The central airways are patent. Musculoskeletal: Osseous metastatic disease involving the right T6 and T7 and T8 with extension into the T7-T8 neural foramen is new since the prior CT. There is metastatic destruction of the posterior right eighth rib close to the costovertebral junction. CT ABDOMEN PELVIS FINDINGS No intra-abdominal free air or free fluid. Hepatobiliary: Several hepatic hypodense lesions are not characterized but similar to prior CT. No intrahepatic biliary dilatation. The gallbladder is grossly unremarkable. Pancreas: Unremarkable. No pancreatic ductal dilatation or surrounding inflammatory changes. Spleen: Normal in size without focal abnormality. Adrenals/Urinary Tract: The adrenal glands unremarkable. There is no hydronephrosis or nephrolithiasis on either side. The visualized ureters and urinary bladder appear unremarkable. Stomach/Bowel: There is no  bowel obstruction or active inflammation. The appendix is normal. Vascular/Lymphatic: Moderate aortoiliac atherosclerotic disease. The IVC is unremarkable. No portal venous gas. No adenopathy. Reproductive: The prostate and seminal vesicles are grossly unremarkable. Other: Mild subcutaneous edema. Musculoskeletal: Multilevel degenerative changes and osteopenia. There is compression fracture of L2 with approximately 10% loss of vertebral body height, new since the prior CT, and acute appearing. This is likely related to osteopenia and metastatic/pathologic fracture. Faint  lucencies through the posterior aspect of the L1-L3 vertebra most consistent with metastatic disease. No significant retropulsion. IMPRESSION: 1. Progression of the metastatic disease in the chest, abdomen, and pelvis compared to the prior CT of 06/09/2020. Osseous metastatic disease involving the right T6 and T7 and T8 with extension into the T7-T8 neural foramen is new since the prior CT. 2. Acute compression fracture of L2 with approximately 10% loss of vertebral body height. This is likely related to osteopenia and metastatic/pathologic fracture. 3. Similar appearance of small right pleural effusion with right pleural thickening/nodularity. 4. New streaky and nodular pulmonary densities primarily involving the right lower lobe consistent with progression of malignancy. 5. No bowel obstruction. Normal appendix. 6. Aortic Atherosclerosis (ICD10-I70.0). Electronically Signed   By: Anner Crete M.D.   On: 11/08/2020 21:12        Scheduled Meds: . atorvastatin  80 mg Oral Daily  . clopidogrel  75 mg Oral Q breakfast  . dronabinol  2.5 mg Oral BID AC  . enoxaparin (LOVENOX) injection  40 mg Subcutaneous Q24H  . [START ON 11/11/2020] fentaNYL  1 patch Transdermal Q72H  . lidocaine-prilocaine   Topical Once  . potassium chloride  40 mEq Oral BID  . pregabalin  75 mg Oral BID  . sodium chloride flush  3 mL Intravenous Q12H   Continuous Infusions: . sodium chloride    . magnesium sulfate bolus IVPB    . magnesium sulfate bolus IVPB 4 g (11/09/20 0609)  . potassium chloride 10 mEq (11/09/20 0559)     LOS: 1 day    Time spent: 35 minutes.     Elmarie Shiley, MD [facility]   If 7PM-7AM, please contact night-coverage [email or web address]  11/09/2020, 7:29 AM
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
## 2020-11-09 NOTE — Progress Notes (Signed)
MD Regalado made aware that pt's HR is between 110-120's. No new orders received, Pt is to DC home with hospice tomorrow.

## 2020-11-09 NOTE — Progress Notes (Signed)
   11/09/20 0025  Assess: MEWS Score  Temp 97.9 F (36.6 C)  BP 120/87  Pulse Rate (!) 119  Resp 20  SpO2 100 %  Assess: MEWS Score  MEWS Temp 0  MEWS Systolic 0  MEWS Pulse 2  MEWS RR 0  MEWS LOC 0  MEWS Score 2  MEWS Score Color Yellow  Assess: if the MEWS score is Yellow or Red  Were vital signs taken at a resting state? Yes  Focused Assessment No change from prior assessment  Early Detection of Sepsis Score *See Row Information* Medium  MEWS guidelines implemented *See Row Information* Yes  Treat  MEWS Interventions Escalated (See documentation below)  Pain Scale 0-10  Pain Score 0  Take Vital Signs  Increase Vital Sign Frequency  Yellow: Q 2hr X 2 then Q 4hr X 2, if remains yellow, continue Q 4hrs  Escalate  MEWS: Escalate Yellow: discuss with charge nurse/RN and consider discussing with provider and RRT  Notify: Charge Nurse/RN  Name of Charge Nurse/RN Notified [place], RN  Date Charge Nurse/RN Notified 11/09/20  Time Charge Nurse/RN Notified 0626  Notify: Provider  Provider Name/Title Randol Kern, NP  Date Provider Notified 11/09/20  Time Provider Notified 0030  Notification Type  (Secure chat)  Notification Reason Other (Comment) (Yellow MEWS)  Provider response No new orders  Date of Provider Response 11/09/20  Time of Provider Response 0037  Patient was Yellow MEWS in ED. HR elevated. On telemetry. Remains a yellow MEWS. NP notified, no interventions at this time. Will continue to monitor
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
## 2020-11-09 NOTE — Progress Notes (Signed)
Patient de-access his port, he is refusing another access, IVF were stopped, MD notified.

## 2020-11-09 NOTE — Evaluation (Signed)
Physical Therapy Evaluation Patient Details Name: Ryan Harvey MRN: [id number] DOB: Jul 01, 1945 Today's Date: 11/09/2020   History of Present Illness  Pt admitted for AMS and increased weakness. History includes stage 4 adenocarcinoma of lung, currently having chemo, HTN, and spinal cord compression. Mets to spine (T6-T8)and lung. Of note, acute compression fx in L2.  Clinical Impression  Pt is a pleasant 75 year old male who was admitted for AMS/weakness/hallucinations. Per friend in room, pt doing much improved today with no hallucinations noted. Pt is A&O x 4 and pleasant; agreeable to therapy evaluation. Pt performs bed mobility, transfers, and ambulation with cga and RW. Pt reports he has chronic B LE numbness and weakness. Does depend on son for assistance in home. Pt demonstrates deficits with strength/mobility/endurance. Would benefit from skilled PT to address above deficits and promote optimal return to PLOF. Recommend transition to [facility] upon discharge from acute hospitalization.     Follow Up Recommendations Home health PT;Supervision for mobility/OOB    Equipment Recommendations  None recommended by PT    Recommendations for Other Services       Precautions / Restrictions Precautions Precautions: Fall Restrictions Weight Bearing Restrictions: No      Mobility  Bed Mobility Overal bed mobility: Needs Assistance Bed Mobility: Supine to Sit     Supine to sit: Min guard     General bed mobility comments: slow transition to EOB. Once seated, upright posture noted. Flexed posture noted    Transfers Overall transfer level: Needs assistance Equipment used: Rolling walker (2 wheeled) Transfers: Sit to/from Stand Sit to Stand: Min guard         General transfer comment: cues for pushing from seated surface.  Ambulation/Gait Ambulation/Gait assistance: Min guard Gait Distance (Feet): 5 Feet Assistive device: Rolling walker (2 wheeled) Gait Pattern/deviations:  Step-to pattern     General Gait Details: fatigues with exertion. Safe technique with use of RW  Stairs            Wheelchair Mobility    Modified Rankin (Stroke Patients Only)       Balance Overall balance assessment: Needs assistance Sitting-balance support: Bilateral upper extremity supported Sitting balance-Leahy Scale: Good     Standing balance support: Bilateral upper extremity supported Standing balance-Leahy Scale: Fair Standing balance comment: needs support on RW                             Pertinent Vitals/Pain Pain Assessment: Faces Faces Pain Scale: Hurts a little bit Pain Location: low back Pain Descriptors / Indicators: Aching Pain Intervention(s): Limited activity within patient's tolerance;Repositioned    Home Living Family/patient expects to be discharged to:: Private residence Living Arrangements: Children Available Help at Discharge: Family;Available 24 hours/day (lives with son) Type of Home: House Home Access: Stairs to enter Entrance Stairs-Rails: None Entrance Stairs-Number of Steps: 1 small 6" step Home Layout: Two level Home Equipment: Walker - 2 wheels Additional Comments: has ability to live downstairs on couch or upstairs in bedroom depedning on energy levels    Prior Function Level of Independence: Needs assistance   Gait / Transfers Assistance Needed: reports household ambulation using RW. Recent falls due to weakness  ADL's / Homemaking Assistance Needed: per patient, son assist in ADLs        Hand Dominance        Extremity/Trunk Assessment   Upper Extremity Assessment Upper Extremity Assessment: Generalized weakness (B UE grossly 4/5)    Lower  Extremity Assessment Lower Extremity Assessment: Generalized weakness (B LE grossly 3+/5)       Communication   Communication: No difficulties  Cognition Arousal/Alertness: Awake/alert Behavior During Therapy: WFL for tasks assessed/performed Overall  Cognitive Status: Within Functional Limits for tasks assessed                                 General Comments: pleasant and agreeable to therapy      General Comments      Exercises Other Exercises Other Exercises: HEP reviewed for B LE including alt seated marching, LAQ and SLRs. 5-10 reps with supervision   Assessment/Plan    PT Assessment Patient needs continued PT services  PT Problem List Decreased strength;Decreased activity tolerance;Decreased balance;Decreased mobility       PT Treatment Interventions Gait training;DME instruction;Balance training;Therapeutic exercise    PT Goals (Current goals can be found in the Care Plan section)  Acute Rehab PT Goals Patient Stated Goal: to go home PT Goal Formulation: With patient Time For Goal Achievement: 11/23/20 Potential to Achieve Goals: Good    Frequency Min 2X/week   Barriers to discharge        Co-evaluation               AM-PAC PT "6 Clicks" Mobility  Outcome Measure Help needed turning from your back to your side while in a flat bed without using bedrails?: A Little Help needed moving from lying on your back to sitting on the side of a flat bed without using bedrails?: A Little Help needed moving to and from a bed to a chair (including a wheelchair)?: A Little Help needed standing up from a chair using your arms (e.g., wheelchair or bedside chair)?: A Little Help needed to walk in hospital room?: A Little Help needed climbing 3-5 steps with a railing? : A Lot 6 Click Score: 17    End of Session Equipment Utilized During Treatment: Gait belt Activity Tolerance: Patient tolerated treatment well Patient left: in chair;with chair alarm set;with family/visitor present Nurse Communication: Mobility status PT Visit Diagnosis: Unsteadiness on feet (R26.81);Muscle weakness (generalized) (M62.81);Difficulty in walking, not elsewhere classified (R26.2)    Time: 5573-2202 PT Time Calculation  (min) (ACUTE ONLY): 19 min   Charges:   PT Evaluation $PT Eval Low Complexity: 1 Low PT Treatments $Therapeutic Exercise: 8-22 mins        Greggory Stallion, PT, DPT [phone]   Paige Vanderwoude 11/09/2020, 1:45 PM

## 2020-11-09 NOTE — TOC Progression Note (Signed)
Transition of Care [facility]) - Progression Note    Patient Details  Name: Ryan Harvey MRN: [id number] Date of Birth: 12/21/1945  Transition of Care [facility]) CM/SW [place], RN Phone Number: 11/09/2020, 3:44 PM  Clinical Narrative: Per Kieth Brightly, Home Hospice has been arranged with family's consent patient will be discharged home tomorrow, no DME needed, transportation by family private vehicle. TOC needs not needed.           Expected Discharge Plan and Services                                                 Social Determinants of Health (SDOH) Interventions    Readmission Risk Interventions No flowsheet data found.

## 2020-11-09 NOTE — Progress Notes (Signed)
IV team not in hospital. No nurses trained on accessing ports. Patient refusing a new IV. MD notified. Instructed to push fluids. Patient is supposed to be discharged home to hospice tomorrow.

## 2020-11-10 DIAGNOSIS — C3491 Malignant neoplasm of unspecified part of right bronchus or lung: Secondary | ICD-10-CM

## 2020-11-10 DIAGNOSIS — G9340 Encephalopathy, unspecified: Secondary | ICD-10-CM

## 2020-11-10 MED ORDER — DEXAMETHASONE 2 MG PO TABS: 2.0000 mg | ORAL_TABLET | Freq: Every day | ORAL | 0 refills | Status: AC

## 2020-11-10 MED ORDER — ONDANSETRON HCL 4 MG PO TABS: 4.0000 mg | ORAL_TABLET | Freq: Four times a day (QID) | ORAL | 1 refills | Status: AC | PRN

## 2020-11-10 MED ORDER — OXYCODONE HCL 10 MG PO TABS: 10.0000 mg | ORAL_TABLET | ORAL | 0 refills | Status: AC | PRN

## 2020-11-10 MED ORDER — FENTANYL 12 MCG/HR TD PT72: 1.0000 | MEDICATED_PATCH | TRANSDERMAL | 0 refills | Status: AC

## 2020-11-10 NOTE — Progress Notes (Signed)
Patient has refused all vitals to be taken. Educated on the importance. Patient is confused, his friend is in the room with him providing support.

## 2020-11-10 NOTE — Progress Notes (Addendum)
Pt is confused x3 this AM. He is refusing medications, VS, breakfast, and denies pain. MD [place] notified. Per oncology pt is to be discharge home with hospice today.   1000 Pt will be going home today. Mrs. [name] (friend) will be providing transportation. Discharge documentation and education given to Mrs [name].

## 2020-11-10 NOTE — TOC Transition Note (Signed)
Transition of Care [facility]) - CM/SW Discharge Note   Patient Details  Name: Ryan Harvey MRN: [id number] Date of Birth: 1945/09/29  Transition of Care [facility]) CM/SW Contact:  Shelbie Hutching, RN Phone Number: 11/10/2020, 10:04 AM   Clinical Narrative:    Patient discharging home with [facility] today.  Girlfriend at bedside providing transportation home.    Final next level of care: Home w Hospice Care Barriers to Discharge: No Barriers Identified   Patient Goals and CMS Choice Patient states their goals for this hospitalization and ongoing recovery are:: patient going home with hospice CMS Medicare.gov Compare Post Acute Care list provided to:: Patient Choice offered to / list presented to : Patient  Discharge Placement                       Discharge Plan and Services                DME Arranged: N/A DME Agency: NA                  Social Determinants of Health (SDOH) Interventions     Readmission Risk Interventions No flowsheet data found.

## 2020-11-11 ENCOUNTER — Other Ambulatory Visit: Payer: Medicare HMO

## 2020-11-11 ENCOUNTER — Ambulatory Visit: Payer: Medicare HMO

## 2020-11-11 NOTE — Discharge Summary (Signed)
Physician Discharge Summary   GHAZI RUMPF OBS:[id number] DOB: 03-04-1946 DOA: 11/08/2020  PCP: Donnamarie Rossetti, PA-C  Admit date: 11/08/2020 Discharge date: 11/10/2020   Admitted From: home Disposition:  Home with hospice Discharging physician: Dwyane Dee, MD  Recommendations for Outpatient Follow-up:  1. Continued on hospice at discharge with [facility]   Patient discharged to home in Discharge Condition: poor Risk of unplanned readmission score: Unplanned Admission- Pilot do not use: 24.82  CODE STATUS: DNR Diet recommendation:   Hospital Course: 75 year old with past medical history significant for stage IV non-small cell lung cancer (adenocarcinoma), currently on chemotherapy (last treatment 2 weeks prior to this admission), recent hospitalization at [facility] for spinal cord compression related to thoracic and lumbar metastatic disease for which patient was treated with palliative radiation therapy with improvement of his symptoms, patient was referred to the ER for evaluation of altered mental status. He has continued having poor oral intake and progressive weakness/weight loss.  He also developed worsening constipation.  Evaluation in the ED: Chest x-ray mild right basilar atelectasis, CT head: No acute evidence of intracranial abnormality.  CT scan chest abdomen and pelvis showed progression of metastatic disease in chest abdomen and pelvis compared to prior CT 06/09/2020.  After further discussion with oncology, patient elected for transitioning to hospice and discharging home with home hospice.  Acute metabolic encephalopathy: Delirium, could be related to dehydration, poor oral intake and underlying malignancy: -CT head negative for acute intracranial abnormalities  Failure to thrive in adult - discharge home with hospice - short course of decadron given at discharge to help with energy and appetite stimulation.  Defer to hospice outpatient for further continuation  but likely may not make much impact  Hypomagnesemia Hypokalemia -Repleted  Stage IV Adenocarcinoma of right lung.  -Continued on pain regimen at discharge - Has undergone palliative radiation for previous cord compression due to metastatic disease - Discharged with hospice  HTN -Amlodipine discontinued due to hypotension  H/O CAD -Plavix was continued for now at discharge - Statin discontinued due to no further mortality benefit  Leukocytosis Received a dose of IV antibiotics in the ED.  Monitor off antibiotics.  UA clear, Chest x ray atelectasis.   Principal Diagnosis: AMS (altered mental status)  Discharge Diagnoses: Active Hospital Problems   Diagnosis Date Noted  . AMS (altered mental status) 11/08/2020  . Primary malignant neoplasm of right lung metastatic to other site [facility])   . Acute encephalopathy   . Palliative care encounter   . Hypokalemia 11/08/2020  . Leukocytosis 11/08/2020  . Stage IV adenocarcinoma of lung, right ([place]) 03/12/2020  . CAD (coronary artery disease) 06/07/2016  . Benign essential HTN 04/17/2016    Resolved Hospital Problems  No resolved problems to display.    Discharge Instructions    Increase activity slowly   Complete by: As directed      Allergies as of 11/10/2020      Reactions   Aspirin Other (See Comments)   Causes bleeding through his nose   Oxycodone Itching, Rash   Shellfish Allergy Rash, Swelling      Medication List    STOP taking these medications   amLODipine 5 MG tablet Commonly known as: NORVASC   atorvastatin 80 MG tablet Commonly known as: Lipitor   dronabinol 2.5 MG capsule Commonly known as: MARINOL   lidocaine-prilocaine cream Commonly known as: EMLA   LORazepam 0.5 MG tablet Commonly known as: Ativan   melatonin 3 MG Tabs tablet   morphine  15 MG tablet Commonly known as: MSIR   OLANZapine 10 MG tablet Commonly known as: ZYPREXA   pantoprazole 40 MG tablet Commonly known as:  PROTONIX   potassium chloride 20 MEQ packet Commonly known as: KLOR-CON   prochlorperazine 10 MG tablet Commonly known as: COMPAZINE     TAKE these medications   clopidogrel 75 MG tablet Commonly known as: PLAVIX Take 1 tablet (75 mg total) by mouth daily with breakfast.   cyclobenzaprine 10 MG tablet Commonly known as: FLEXERIL Take 10 mg by mouth 3 (three) times daily as needed for muscle spasms.   dexamethasone 2 MG tablet Commonly known as: DECADRON Take 1 tablet (2 mg total) by mouth daily for 14 days. What changed:   medication strength  how much to take  additional instructions   fentaNYL 12 MCG/HR Commonly known as: [place] 1 patch onto the skin every 3 (three) days for 15 days.   ondansetron 4 MG tablet Commonly known as: ZOFRAN Take 1 tablet (4 mg total) by mouth every 6 (six) hours as needed for nausea. What changed:   medication strength  how much to take  when to take this  reasons to take this  additional instructions   Oxycodone HCl 10 MG Tabs Take 1 tablet (10 mg total) by mouth every 4 (four) hours as needed (pain). What changed:   reasons to take this  additional instructions   pregabalin 75 MG capsule Commonly known as: Lyrica Take 1 capsule (75 mg total) by mouth 2 (two) times daily.       Allergies  Allergen Reactions  . Aspirin Other (See Comments)    Causes bleeding through his nose  . Oxycodone Itching and Rash  . Shellfish Allergy Rash and Swelling    Consultations: Oncology  Discharge Exam: BP 112/72 (BP Location: Right Arm)   Pulse (!) 122   Temp 98 F (36.7 C) (Oral)   Resp 15   Ht 5' 9.13" (1.756 m)   Wt 60.8 kg   SpO2 100%   BMI 19.72 kg/m  General appearance: alert, cooperative, fatigued and no distress Head: Normocephalic, without obvious abnormality, atraumatic Eyes: EOMI Lungs: clear to auscultation bilaterally Heart: regular rate and rhythm and S1, S2 normal Abdomen: normal findings:  bowel sounds normal and soft, non-tender Extremities: no edema Skin: mobility and turgor normal Neurologic: Grossly normal  The results of significant diagnostics from this hospitalization (including imaging, microbiology, ancillary and laboratory) are listed below for reference.   Microbiology: Recent Results (from the past 240 hour(s))  Resp Panel by RT-PCR (Flu A&B, Covid) Nasopharyngeal Swab     Status: None   Collection Time: 11/08/20  9:38 PM   Specimen: Nasopharyngeal Swab; Nasopharyngeal(NP) swabs in vial transport medium  Result Value Ref Range Status   SARS Coronavirus 2 by RT PCR NEGATIVE NEGATIVE Final    Comment: (NOTE) SARS-CoV-2 target nucleic acids are NOT DETECTED.  The SARS-CoV-2 RNA is generally detectable in upper respiratory specimens during the acute phase of infection. The lowest concentration of SARS-CoV-2 viral copies this assay can detect is 138 copies/mL. A negative result does not preclude SARS-Cov-2 infection and should not be used as the sole basis for treatment or other patient management decisions. A negative result may occur with  improper specimen collection/handling, submission of specimen other than nasopharyngeal swab, presence of viral mutation(s) within the areas targeted by this assay, and inadequate number of viral copies(<138 copies/mL). A negative result must be combined with clinical observations, patient history,  and epidemiological information. The expected result is Negative.  Fact Sheet for Patients:  EntrepreneurPulse.com.au  Fact Sheet for Healthcare Providers:  IncredibleEmployment.be  This test is no t yet approved or cleared by the [place] FDA and  has been authorized for detection and/or diagnosis of SARS-CoV-2 by FDA under an Emergency Use Authorization (EUA). This EUA will remain  in effect (meaning this test can be used) for the duration of the COVID-19 declaration under Section  564(b)(1) of the Act, 21 U.S.C.section 360bbb-3(b)(1), unless the authorization is terminated  or revoked sooner.       Influenza A by PCR NEGATIVE NEGATIVE Final   Influenza B by PCR NEGATIVE NEGATIVE Final    Comment: (NOTE) The Xpert Xpress SARS-CoV-2/FLU/RSV plus assay is intended as an aid in the diagnosis of influenza from Nasopharyngeal swab specimens and should not be used as a sole basis for treatment. Nasal washings and aspirates are unacceptable for Xpert Xpress SARS-CoV-2/FLU/RSV testing.  Fact Sheet for Patients: EntrepreneurPulse.com.au  Fact Sheet for Healthcare Providers: IncredibleEmployment.be  This test is not yet approved or cleared by the [place] FDA and has been authorized for detection and/or diagnosis of SARS-CoV-2 by FDA under an Emergency Use Authorization (EUA). This EUA will remain in effect (meaning this test can be used) for the duration of the COVID-19 declaration under Section 564(b)(1) of the Act, 21 U.S.C. section 360bbb-3(b)(1), unless the authorization is terminated or revoked.  Performed at [facility], [address] [place], [place] [address]      Labs: BNP (last 3 results) No results for input(s): BNP in the last 8760 hours. Basic Metabolic Panel: Recent Labs  Lab 11/08/20 1451 11/08/20 1731 11/09/20 0420  NA 135 137 140  K 3.1* 2.9* 3.1*  CL 98 99 105  CO2 24 25 26   GLUCOSE 118* 121* 99  BUN 8 9 7*  CREATININE 0.97 0.94 0.71  CALCIUM 8.4* 8.6* 7.5*  MG  --   --  1.2*   Liver Function Tests: Recent Labs  Lab 11/08/20 1451 11/08/20 1731  AST 43* 42*  ALT 28 28  ALKPHOS 92 86  BILITOT 1.0 1.2  PROT 6.7 6.6  ALBUMIN 2.9* 2.8*   No results for input(s): LIPASE, AMYLASE in the last 168 hours. Recent Labs  Lab 11/09/20 0811  AMMONIA 12   CBC: Recent Labs  Lab 11/08/20 1451 11/08/20 1731 11/09/20 0420  WBC 19.6* 18.0* 12.8*  NEUTROABS 16.8*  --   --    HGB 10.8* 10.5* 9.2*  HCT 34.2* 33.5* 29.2*  MCV 95.0 95.2 94.5  PLT 237 220 172   Cardiac Enzymes: No results for input(s): CKTOTAL, CKMB, CKMBINDEX, TROPONINI in the last 168 hours. BNP: Invalid input(s): POCBNP CBG: No results for input(s): GLUCAP in the last 168 hours. D-Dimer No results for input(s): DDIMER in the last 72 hours. Hgb A1c No results for input(s): HGBA1C in the last 72 hours. Lipid Profile No results for input(s): CHOL, HDL, LDLCALC, TRIG, CHOLHDL, LDLDIRECT in the last 72 hours. Thyroid function studies No results for input(s): TSH, T4TOTAL, T3FREE, THYROIDAB in the last 72 hours.  Invalid input(s): FREET3 Anemia work up Recent Labs    11/09/20 0811  VITAMINB12 4,087*   Urinalysis    Component Value Date/Time   COLORURINE YELLOW (A) 11/08/2020 [place] (A) 11/08/2020 1852   LABSPEC 1.012 11/08/2020 1852   PHURINE 5.0 11/08/2020 1852   GLUCOSEU NEGATIVE 11/08/2020 1852   HGBUR NEGATIVE 11/08/2020 1852  BILIRUBINUR NEGATIVE 11/08/2020 1852   KETONESUR 5 (A) 11/08/2020 1852   PROTEINUR 30 (A) 11/08/2020 1852   NITRITE NEGATIVE 11/08/2020 1852   LEUKOCYTESUR NEGATIVE 11/08/2020 1852   Sepsis Labs Invalid input(s): PROCALCITONIN,  WBC,  LACTICIDVEN Microbiology Recent Results (from the past 240 hour(s))  Resp Panel by RT-PCR (Flu A&B, Covid) Nasopharyngeal Swab     Status: None   Collection Time: 11/08/20  9:38 PM   Specimen: Nasopharyngeal Swab; Nasopharyngeal(NP) swabs in vial transport medium  Result Value Ref Range Status   SARS Coronavirus 2 by RT PCR NEGATIVE NEGATIVE Final    Comment: (NOTE) SARS-CoV-2 target nucleic acids are NOT DETECTED.  The SARS-CoV-2 RNA is generally detectable in upper respiratory specimens during the acute phase of infection. The lowest concentration of SARS-CoV-2 viral copies this assay can detect is 138 copies/mL. A negative result does not preclude SARS-Cov-2 infection and should not be  used as the sole basis for treatment or other patient management decisions. A negative result may occur with  improper specimen collection/handling, submission of specimen other than nasopharyngeal swab, presence of viral mutation(s) within the areas targeted by this assay, and inadequate number of viral copies(<138 copies/mL). A negative result must be combined with clinical observations, patient history, and epidemiological information. The expected result is Negative.  Fact Sheet for Patients:  EntrepreneurPulse.com.au  Fact Sheet for Healthcare Providers:  IncredibleEmployment.be  This test is no t yet approved or cleared by the [place] FDA and  has been authorized for detection and/or diagnosis of SARS-CoV-2 by FDA under an Emergency Use Authorization (EUA). This EUA will remain  in effect (meaning this test can be used) for the duration of the COVID-19 declaration under Section 564(b)(1) of the Act, 21 U.S.C.section 360bbb-3(b)(1), unless the authorization is terminated  or revoked sooner.       Influenza A by PCR NEGATIVE NEGATIVE Final   Influenza B by PCR NEGATIVE NEGATIVE Final    Comment: (NOTE) The Xpert Xpress SARS-CoV-2/FLU/RSV plus assay is intended as an aid in the diagnosis of influenza from Nasopharyngeal swab specimens and should not be used as a sole basis for treatment. Nasal washings and aspirates are unacceptable for Xpert Xpress SARS-CoV-2/FLU/RSV testing.  Fact Sheet for Patients: EntrepreneurPulse.com.au  Fact Sheet for Healthcare Providers: IncredibleEmployment.be  This test is not yet approved or cleared by the [place] FDA and has been authorized for detection and/or diagnosis of SARS-CoV-2 by FDA under an Emergency Use Authorization (EUA). This EUA will remain in effect (meaning this test can be used) for the duration of the COVID-19 declaration under Section  564(b)(1) of the Act, 21 U.S.C. section 360bbb-3(b)(1), unless the authorization is terminated or revoked.  Performed at [facility], [address] [place], [place] [address]     Procedures/Studies: DG Chest 1 View  Result Date: 11/08/2020 CLINICAL DATA:  Chest pain, shortness of breath, stage IV lung cancer EXAM: CHEST  1 VIEW COMPARISON:  04/19/2020 FINDINGS: RIGHT jugular Port-A-Cath with tip projecting over superior RIGHT atrium. Normal heart size, mediastinal contours, and pulmonary vascularity. Interval removal of RIGHT PleurX catheter. Minimal atelectasis at RIGHT base. Remaining lungs clear. No definite pleural effusion or pneumothorax. IMPRESSION: Mild RIGHT basilar atelectasis. Electronically Signed   By: Lavonia Dana M.D.   On: 11/08/2020 17:09   CT Head Wo Contrast  Result Date: 11/08/2020 CLINICAL DATA:  Mental status change, unknown cause. Additional provided: Confusion, stage IV lung cancer. EXAM: CT HEAD WITHOUT CONTRAST TECHNIQUE: Contiguous axial images were  obtained from the base of the skull through the vertex without intravenous contrast. COMPARISON:  Brain MRI 03/03/2020. FINDINGS: Brain: Please note, assessment for intracranial metastatic disease is limited on this noncontrast head CT. Mild cerebral and cerebellar atrophy. Redemonstrated chronic small vessel infarct within the right corona radiata/basal ganglia. Background moderate/advanced patchy and ill-defined hypoattenuation within the cerebral white matter, nonspecific but compatible with chronic small vessel ischemic disease. There is no acute intracranial hemorrhage. No demarcated cortical infarct. No extra-axial fluid collection. No appreciable intracranial mass. No midline shift. Partially empty sella turcica. Vascular: No hyperdense vessel. Skull: Normal. Negative for fracture or focal lesion. Sinuses/Orbits: Visualized orbits show no acute finding. No significant paranasal sinus disease at the imaged  levels. IMPRESSION: Please note, assessment for intracranial metastatic disease is limited on this non-contrast head CT. No evidence of acute intracranial abnormality. Redemonstrated chronic small-vessel infarct within the right corona radiata/basal ganglia. Stable background moderate/advanced cerebral white matter chronic small vessel ischemic disease. Stable, mild generalized parenchymal atrophy. Electronically Signed   By: Kellie Simmering DO   On: 11/08/2020 18:32   CT HEAD W CONTRAST  Result Date: 11/08/2020 CLINICAL DATA:  Initial evaluation for metastatic disease. EXAM: CT HEAD WITH CONTRAST TECHNIQUE: Contiguous axial images were obtained from the base of the skull through the vertex with intravenous contrast. CONTRAST:  29mL OMNIPAQUE IOHEXOL 300 MG/ML  SOLN COMPARISON:  Prior CT from earlier the same day. FINDINGS: Brain: Postcontrast imaging of the brain demonstrates no abnormal or pathologic enhancement. No mass lesion or evidence for intracranial metastatic disease. Atrophy with chronic small vessel ischemic disease again noted. No other acute or interval intracranial abnormality. Vascular: Normal intravascular enhancement seen throughout the intracranial vasculature. Skull: Scalp soft tissues within normal limits. Calvarium intact. No discrete osseous lesions. Sinuses/Orbits: Globes orbital soft tissues within normal limits. Paranasal sinuses and mastoid air cells are clear. Other: None. IMPRESSION: 1. No CT evidence for intracranial metastatic disease. 2. Atrophy with chronic small vessel ischemic disease. Electronically Signed   By: Jeannine Boga M.D.   On: 11/08/2020 23:58   CT CHEST ABDOMEN PELVIS WO CONTRAST  Addendum Date: 11/09/2020   ADDENDUM REPORT: 11/09/2020 15:35 ADDENDUM: Please note there has been no significant interval change in the metastatic disease compared to the PET-CT of 10/06/2020. Electronically Signed   By: Anner Crete M.D.   On: 11/09/2020 15:35   Result  Date: 11/09/2020 CLINICAL DATA:  75 year old male with sepsis. Stage IV lung cancer. EXAM: CT CHEST, ABDOMEN AND PELVIS WITHOUT CONTRAST TECHNIQUE: Multidetector CT imaging of the chest, abdomen and pelvis was performed following the standard protocol without IV contrast. COMPARISON:  CT abdomen pelvis dated 09/15/2020. PET CT dated 10/06/2020 and CT of the chest abdomen pelvis dated 06/09/2020. FINDINGS: Evaluation of this exam is limited in the absence of intravenous contrast. CT CHEST FINDINGS Cardiovascular: There is no cardiomegaly or pericardial effusion. There is coronary vascular calcification. Right-sided Port-A-Cath with tip at the cavoatrial junction. There is mild atherosclerotic calcification of the thoracic aorta. No aneurysmal dilatation. The central pulmonary arteries are grossly unremarkable. Mediastinum/Nodes: No hilar or mediastinal adenopathy. Evaluation however is limited in the absence of intravenous contrast. The esophagus is grossly unremarkable. No mediastinal fluid collection. Lungs/Pleura: Trace right pleural effusion. There is mild apparent irregularity of the right pleural and slight nodularity of the right posterior subpleural region in keeping with known metastatic disease and malignant effusion. Bilateral streaky and nodular densities primarily involving the posterior right lower lobe with interval progression compared to the  CT of 06/09/2020. A 19 mm subpleural nodule in the right lower lobe (87/4) appears similar to prior CT. New nodular density in the right upper lobe with the linear component measuring up to 2.3 cm in length, new since the prior CT. There is no pneumothorax. The central airways are patent. Musculoskeletal: Osseous metastatic disease involving the right T6 and T7 and T8 with extension into the T7-T8 neural foramen is new since the prior CT. There is metastatic destruction of the posterior right eighth rib close to the costovertebral junction. CT ABDOMEN PELVIS  FINDINGS No intra-abdominal free air or free fluid. Hepatobiliary: Several hepatic hypodense lesions are not characterized but similar to prior CT. No intrahepatic biliary dilatation. The gallbladder is grossly unremarkable. Pancreas: Unremarkable. No pancreatic ductal dilatation or surrounding inflammatory changes. Spleen: Normal in size without focal abnormality. Adrenals/Urinary Tract: The adrenal glands unremarkable. There is no hydronephrosis or nephrolithiasis on either side. The visualized ureters and urinary bladder appear unremarkable. Stomach/Bowel: There is no bowel obstruction or active inflammation. The appendix is normal. Vascular/Lymphatic: Moderate aortoiliac atherosclerotic disease. The IVC is unremarkable. No portal venous gas. No adenopathy. Reproductive: The prostate and seminal vesicles are grossly unremarkable. Other: Mild subcutaneous edema. Musculoskeletal: Multilevel degenerative changes and osteopenia. There is compression fracture of L2 with approximately 10% loss of vertebral body height, new since the prior CT, and acute appearing. This is likely related to osteopenia and metastatic/pathologic fracture. Faint lucencies through the posterior aspect of the L1-L3 vertebra most consistent with metastatic disease. No significant retropulsion. IMPRESSION: 1. Progression of the metastatic disease in the chest, abdomen, and pelvis compared to the prior CT of 06/09/2020. Osseous metastatic disease involving the right T6 and T7 and T8 with extension into the T7-T8 neural foramen is new since the prior CT. 2. Acute compression fracture of L2 with approximately 10% loss of vertebral body height. This is likely related to osteopenia and metastatic/pathologic fracture. 3. Similar appearance of small right pleural effusion with right pleural thickening/nodularity. 4. New streaky and nodular pulmonary densities primarily involving the right lower lobe consistent with progression of malignancy. 5. No  bowel obstruction. Normal appendix. 6. Aortic Atherosclerosis (ICD10-I70.0). Electronically Signed: By: Anner Crete M.D. On: 11/08/2020 21:12     Time coordinating discharge: Over 30 minutes    Dwyane Dee, MD  [facility] 11/11/2020, 3:34 PM

## 2020-11-12 ENCOUNTER — Inpatient Hospital Stay: Payer: Medicare HMO

## 2020-11-12 ENCOUNTER — Inpatient Hospital Stay: Payer: Medicare HMO | Admitting: Hospice and Palliative Medicine

## 2020-11-12 ENCOUNTER — Inpatient Hospital Stay: Payer: Medicare HMO | Admitting: Oncology

## 2020-11-17 ENCOUNTER — Telehealth: Payer: Self-pay | Admitting: Hospice and Palliative Medicine

## 2020-11-17 NOTE — Telephone Encounter (Signed)
Received a call from patient's hospice nurse.  Lyrica is not on hospice formulary and they are requesting permission to rotate to gabapentin.  Okay to switch to gabapentin 100 mg 3 times daily and may increase to 300 mg 3 times daily as tolerated.

## 2020-12-08 ENCOUNTER — Telehealth: Payer: Self-pay | Admitting: *Deleted

## 2020-12-08 NOTE — Telephone Encounter (Signed)
Called hospice and spoke to [facility] about the UA results. I see that the bottom says culture to follow. Dr. Janese Banks says that if he is feeling pain urinating then he could have a atb but would like to wait for culture. Crystal said that there was no notation in chart so they will wait for culture and if anything changes then she  or the rn assigned to him will reach out to our office

## 2020-12-09 ENCOUNTER — Other Ambulatory Visit: Payer: Self-pay | Admitting: *Deleted

## 2020-12-09 MED ORDER — LORAZEPAM 0.5 MG PO TABS: 0.5000 mg | ORAL_TABLET | Freq: Four times a day (QID) | ORAL | 1 refills | Status: AC | PRN

## 2020-12-10 ENCOUNTER — Other Ambulatory Visit: Payer: Self-pay | Admitting: *Deleted

## 2020-12-10 MED ORDER — FENTANYL 12 MCG/HR TD PT72: 1.0000 | MEDICATED_PATCH | TRANSDERMAL | 0 refills | Status: AC

## 2020-12-16 ENCOUNTER — Encounter: Payer: Self-pay | Admitting: Oncology

## 2020-12-17 DEATH — deceased

## 2021-05-20 NOTE — Telephone Encounter (Signed)
See previous note from 11/08/20, signing note

## 2021-10-21 IMAGING — PT NM PET TUM IMG INITIAL (PI) SKULL BASE T - THIGH
7 series · 25 of 25 positions shown · non-contrast
Comparison: Chest radiograph 02/07/2020 and CT chest from
03/05/2007

CLINICAL DATA: Initial treatment strategy for malignant right
pleural effusion.

EXAM:
NUCLEAR MEDICINE PET SKULL BASE TO THIGH
TECHNIQUE: 8.3 mCi F-18 FDG was injected intravenously. Full-ring PET imaging
was performed from the skull base to thigh after the radiotracer. CT
data was obtained and used for attenuation correction and anatomic
localization.
Fasting blood glucose: 111 mg/dl

[Series 3: pet sk_thigh ac · axial · 5.0mm · 4.07mm/px · z∈[-917,-45]mm · 5 of 219 slices shown]
[im 1/219]
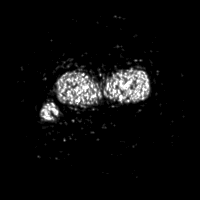
[im 55/219]
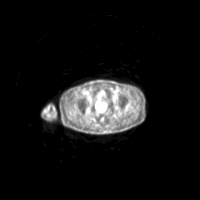
[im 110/219]
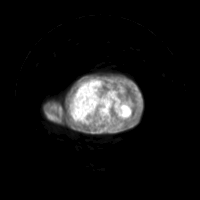
[im 164/219]
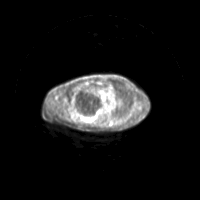
[im 219/219]
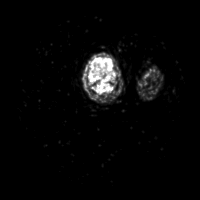

[Series 4: ct sk_thigh 5.0 b31f · axial · 5.0mm · 0.89mm/px · z∈[-917,-45]mm · 5 of 219 slices shown]
[im 1/219]
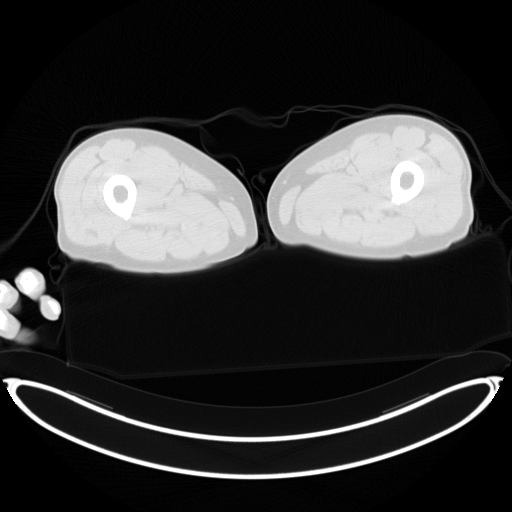
[im 55/219]
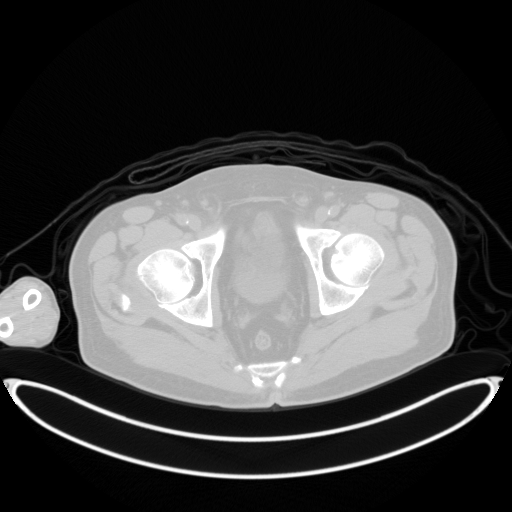
[im 110/219]
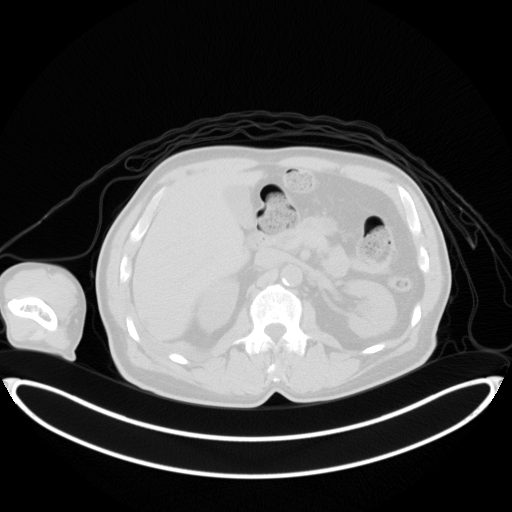
[im 164/219]
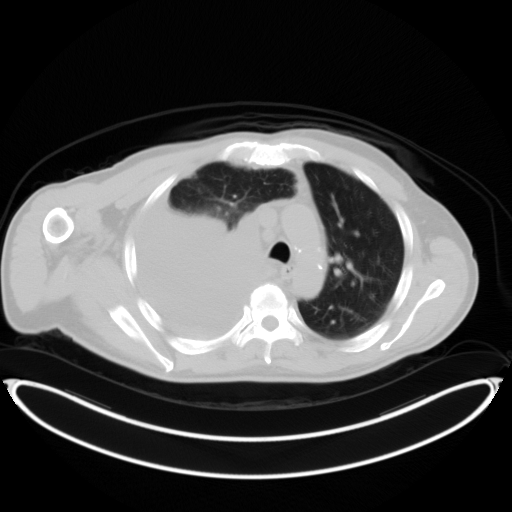
[im 219/219  brain]
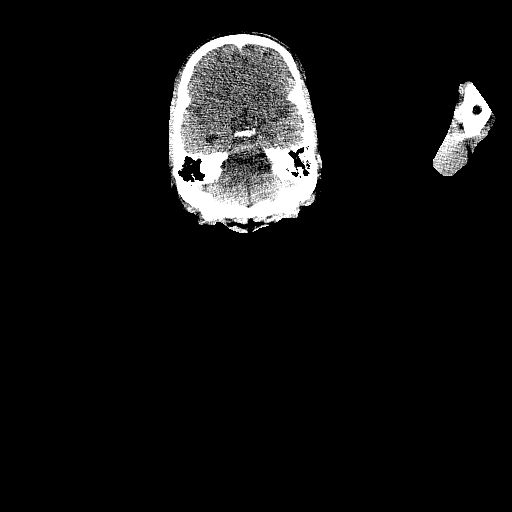

[Series 5: pet sk_thigh nac · axial · 5.0mm · 4.07mm/px · z∈[-917,-45]mm · 5 of 219 slices shown]
[im 1/219]
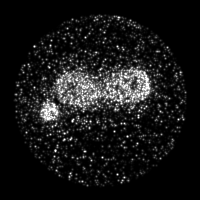
[im 55/219]
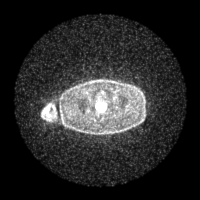
[im 110/219]
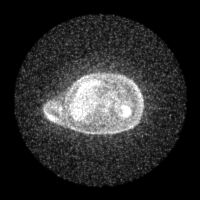
[im 164/219]
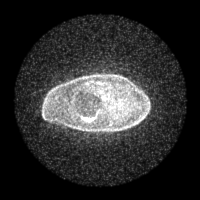
[im 219/219]
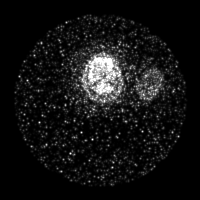

[Series 8: ct sk_thigh 5.0 b70f lung_bone · axial · 5.0mm · 0.61mm/px · z∈[-437,-169]mm · 2 of 68 slices shown]
[im 1/68  bone]
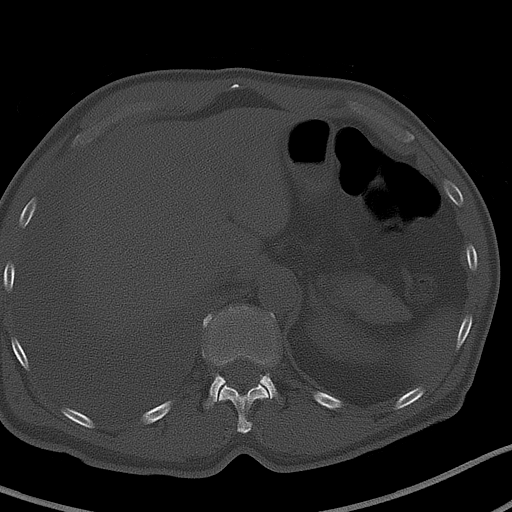
[im 68/68  bone]
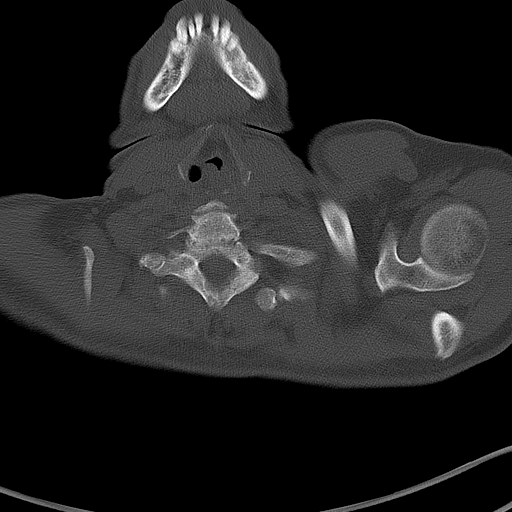

[Series 603: range-ct sk_thigh 5.0 (id)<alpha range> · 2 of 62 slices shown (1 of 2)]
[im 1/62]
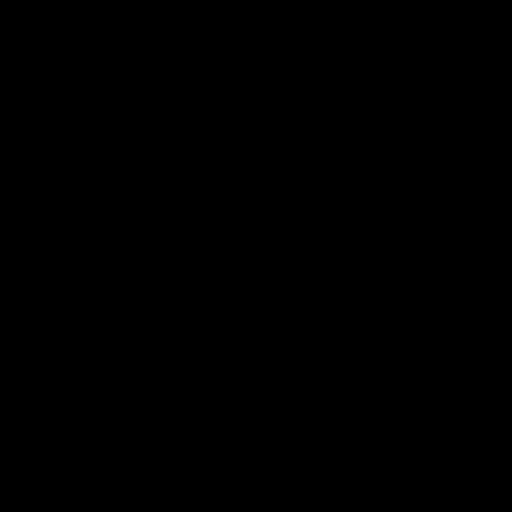
[im 62/62]
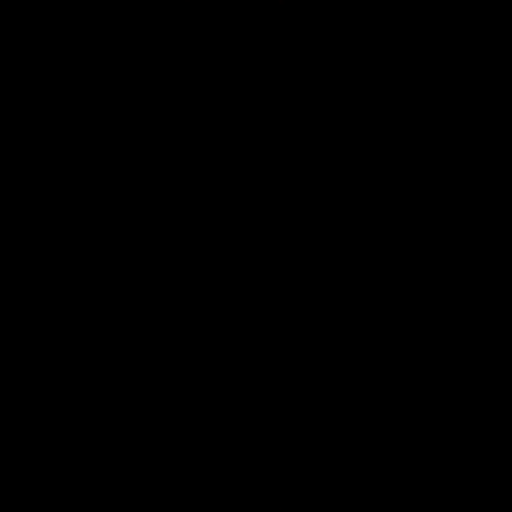

[Series 604: mip range 5 · coronal · 1.81mm/px · 1 of 32 slices shown]
[im 1/32]
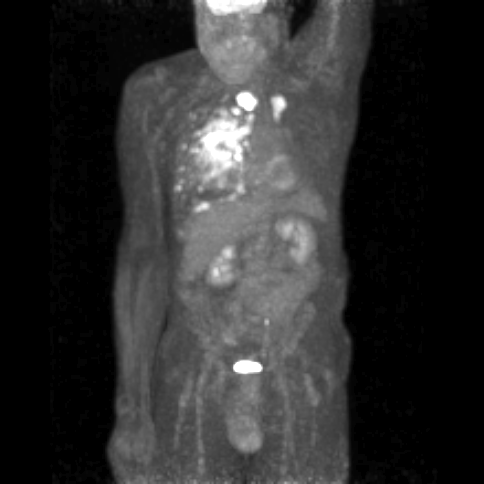

[Series 605: range-ct sk_thigh 5.0 (id)<alpha range> · 5 of 201 slices shown (2 of 2)]
[im 1/201]
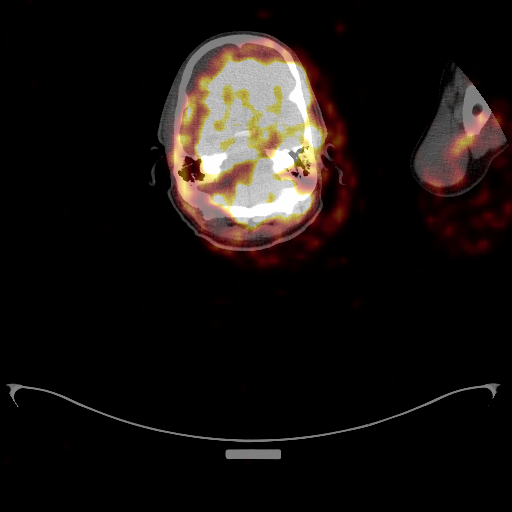
[im 51/201]
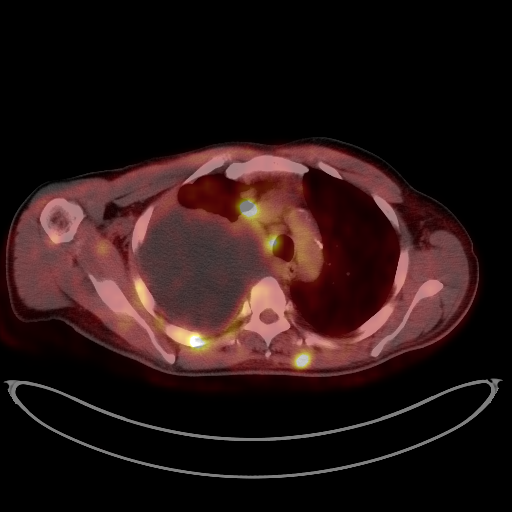
[im 101/201]
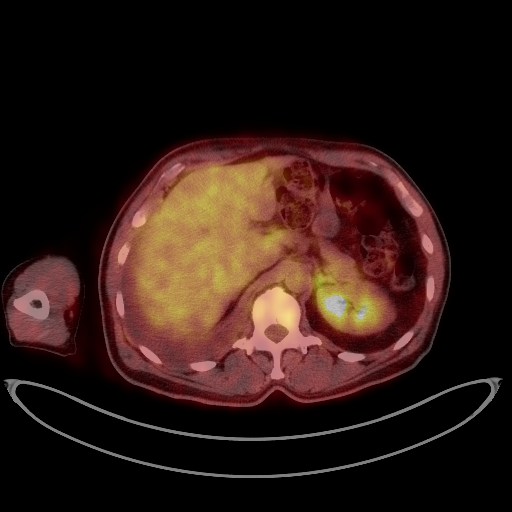
[im 151/201]
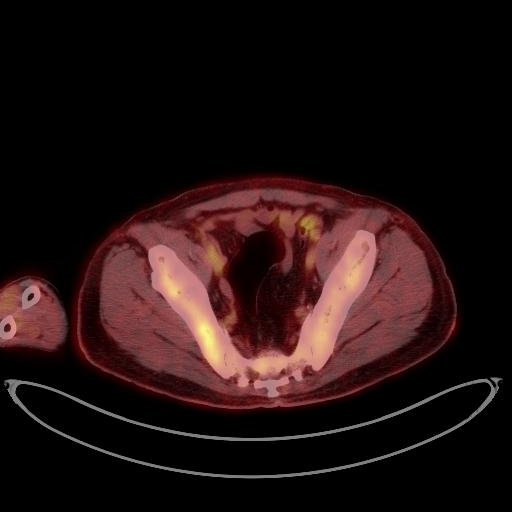
[im 201/201]
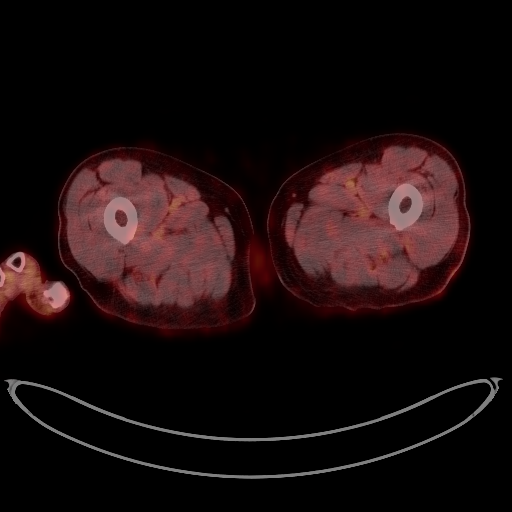

[25 of 25 positions shown; findings below may reference images not displayed]

FINDINGS: Mediastinal blood pool activity: SUV max

Liver activity: SUV max N/A

NECK: No significant abnormal hypermetabolic activity in this
region.

Incidental CT findings: none

CHEST: A hypermetabolic lesion in the atelectatic right lower lobe
measures approximately 3.7 by 2.3 cm, with maximum SUV 14.0,
favoring bronchogenic carcinoma.

There is a large right pleural effusion with extensive
hypermetabolic nodularity compatible with malignant effusion. A
pleural-based 3.5 by 1.9 cm deposit along the right upper
mediastinal pleural margin on image 46 of series 4 has a maximum SUV
of 27.0. Posterior pleural-based tumor is associated with
destruction of a 4.4 cm segment of the right seventh rib with
associated pathologic fracture on image 61 of series 4, maximum SUV
in this vicinity is 23.9. This appearance is compatible with chest
wall invasion.

Hypermetabolic left paratracheal adenopathy, lower paratracheal node
1.7 cm in short axis with maximum SUV 14.0.

A mass along the left paraspinal musculature at the level of the
fourth and fifth ribs has maximum SUV of 12.8, compatible with a
metastatic deposit.

Only about 30% of the right lung is aerated. There is some
nodularity in the right upper lobe including a 0.7 cm nodule on
image 55/4 which is not appreciably hypermetabolic but which is
below sensitive PET-CT size thresholds.

Incidental CT findings: Atherosclerotic thoracic aorta and right
coronary artery. Leftward shift of mediastinal structures.

ABDOMEN/PELVIS: Hypodense lesions in the liver appear relatively
photopenic and are likely cysts. Nondistended urinary bladder with
trace activity along the anterior superior urinary bladder probably
from a small amount of luminal excreted FDG.

Incidental CT findings: Aortoiliac atherosclerotic vascular disease.
Mild prostatomegaly.

SKELETON: Destructive lesion of the right seventh rib due to chest
wall invasion of pleural tumor as described in the chest section
above.

Incidental CT findings: none
IMPRESSION: 1. Hypermetabolic 3.7 cm right lower lobe mass, with extensive right
pleural metastatic disease and hypermetabolic right paratracheal
adenopathy. Posterior pleural deposit on the right erodes the right
seventh rib with associated pathologic fracture, compatible with
chest wall invasion. There is also a hypermetabolic metastasis to
the left paraspinal musculature at the vertical level of the fourth
and fifth ribs. Appearance compatible with T3 N2 M1 disease (stage
DEEQA RAYAAN).
2. No findings of metastatic disease to the abdomen/pelvis or neck.
3. Only about 30% of the right lung is aerated, and there is
mediastinal shift to the left.
4.  Aortic Atherosclerosis ([id number]).
5. Mild prostatomegaly

## 2021-11-11 IMAGING — CR DG CHEST 2V
1 series · 2 of 2 positions shown · non-contrast
Comparison: March 18, 2020.

CLINICAL DATA: Right pleural effusion.

EXAM:
CHEST - 2 VIEW

[Series 1: dg chest 2 view · 0.14mm/px · 2 of 2 slices shown]
[im 1/2]
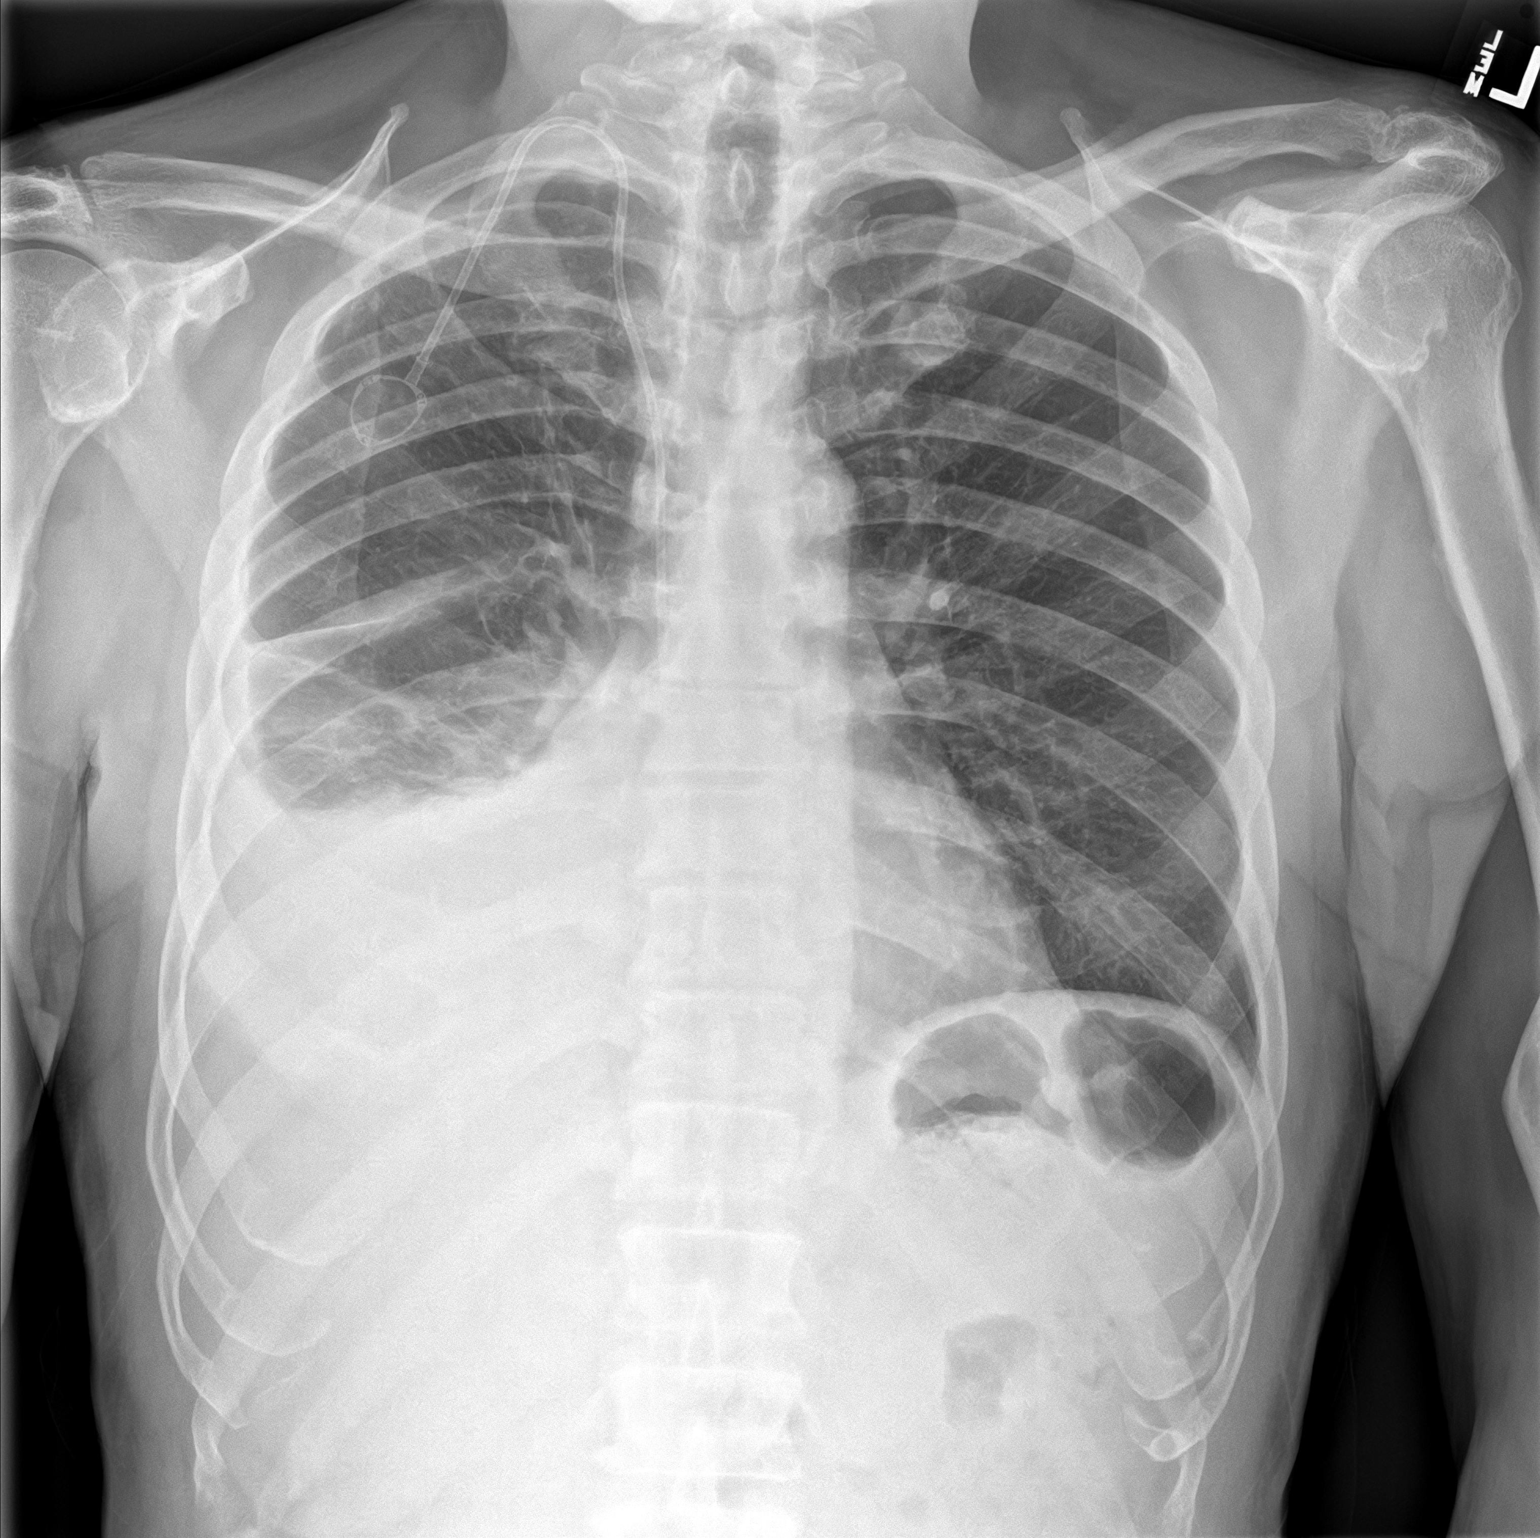
[im 2/2]
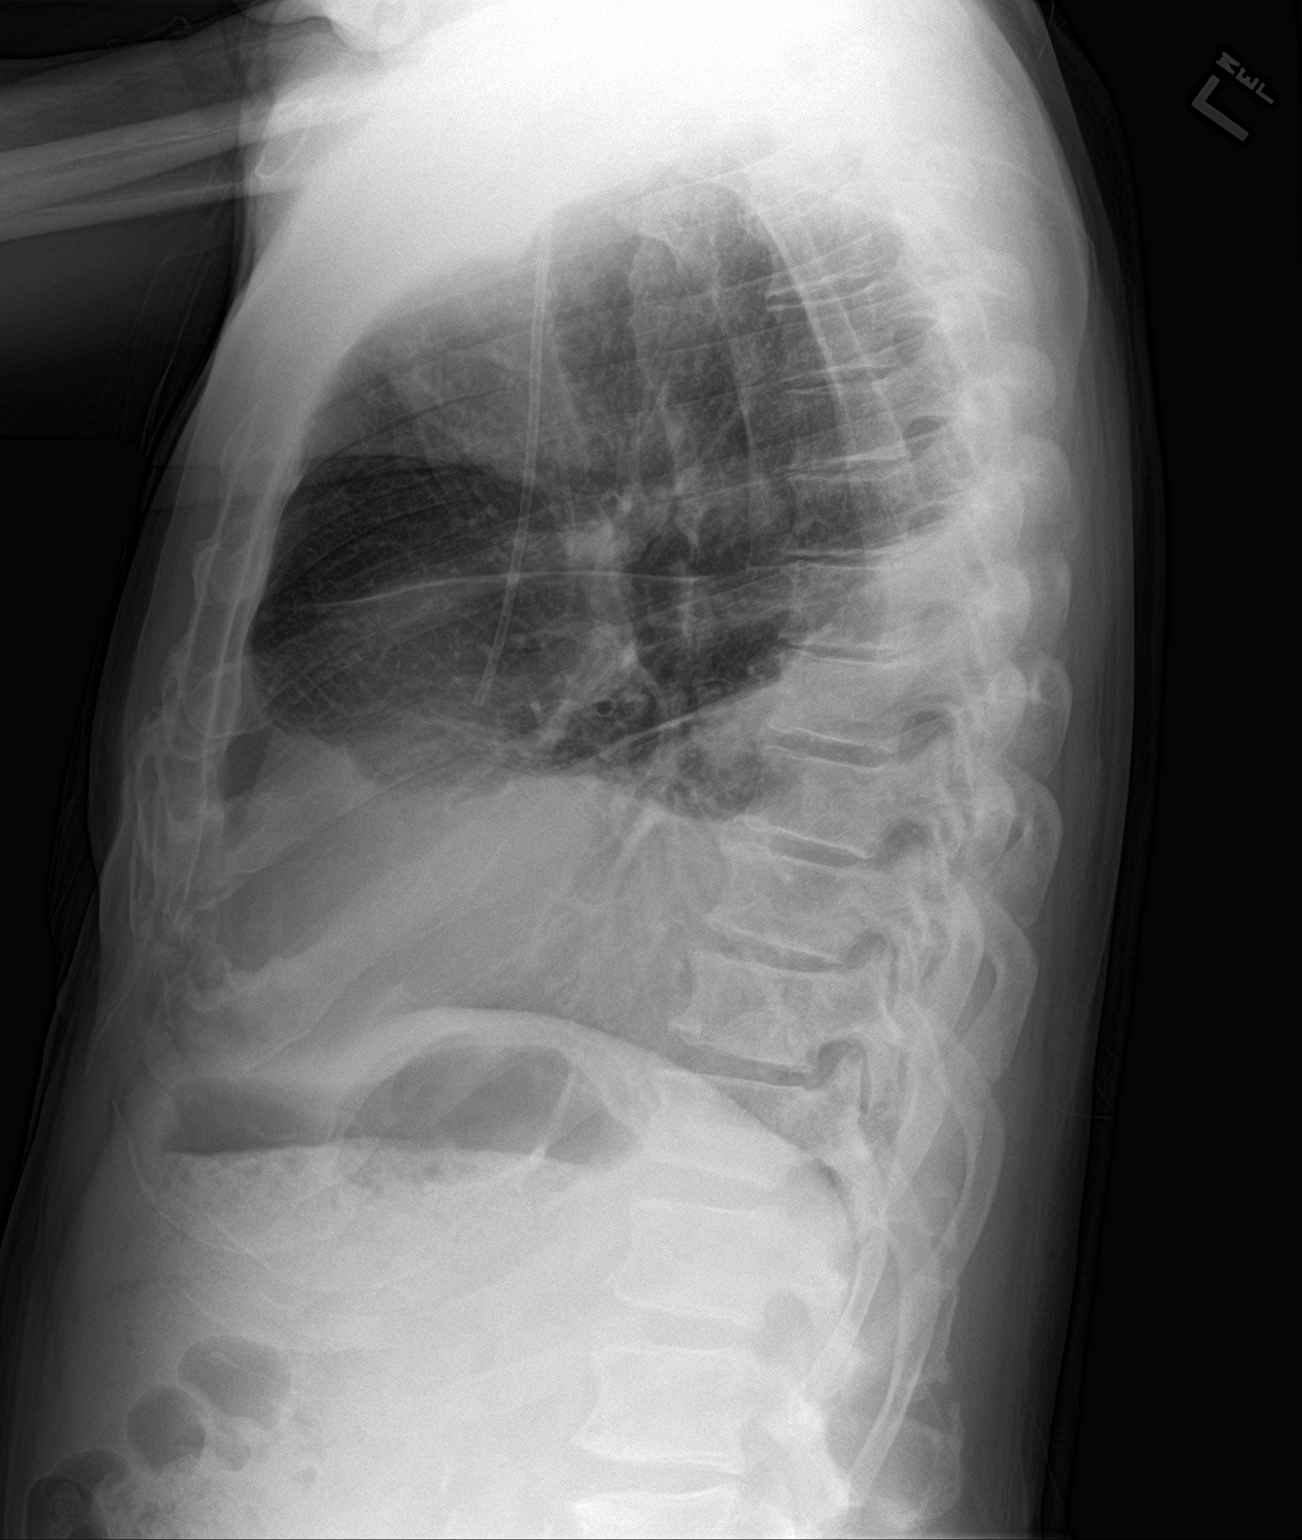

[2 of 2 positions shown; findings below may reference images not displayed]

FINDINGS: The heart size and mediastinal contours are within normal limits. No
pneumothorax is noted. Left lung is clear. Interval placement of
right internal jugular Port-A-Cath with distal tip in expected
position of cavoatrial junction. Right pleural effusion is mildly
enlarged compared to prior exam, with associated right basilar
atelectasis. The visualized skeletal structures are unremarkable.
IMPRESSION: Right pleural effusion is mildly enlarged compared to prior exam,
with associated right basilar atelectasis.
